# Patient Record
Sex: Male | Born: 1955 | Race: White | Hispanic: No | Smoking: Former smoker
Health system: Southern US, Community
[De-identification: ages and names within clinical notes are randomized; demographics above are authoritative.]

## PROBLEM LIST (undated history)

## (undated) DIAGNOSIS — T7840XA Allergy, unspecified, initial encounter: Secondary | ICD-10-CM

## (undated) DIAGNOSIS — M1990 Unspecified osteoarthritis, unspecified site: Secondary | ICD-10-CM

## (undated) HISTORY — DX: Allergy, unspecified, initial encounter: T78.40XA

## (undated) HISTORY — DX: Unspecified osteoarthritis, unspecified site: M19.90

---

## 2000-02-11 ENCOUNTER — Encounter: Payer: Self-pay | Admitting: Emergency Medicine

## 2000-02-11 ENCOUNTER — Emergency Department (HOSPITAL_COMMUNITY): Admission: EM | Admit: 2000-02-11 | Discharge: 2000-02-11 | Payer: Self-pay | Admitting: Emergency Medicine

## 2009-03-27 ENCOUNTER — Emergency Department: Payer: Self-pay | Admitting: Emergency Medicine

## 2016-09-11 ENCOUNTER — Emergency Department
Admission: EM | Admit: 2016-09-11 | Discharge: 2016-09-11 | Disposition: A | Discharge status: Home / Self Care | Payer: Worker's Compensation | Attending: Emergency Medicine | Admitting: Emergency Medicine

## 2016-09-11 ENCOUNTER — Emergency Department: Payer: Worker's Compensation

## 2016-09-11 ENCOUNTER — Encounter: Payer: Self-pay | Admitting: Emergency Medicine

## 2016-09-11 DIAGNOSIS — Y929 Unspecified place or not applicable: Secondary | ICD-10-CM | POA: Insufficient documentation

## 2016-09-11 DIAGNOSIS — W268XXA Contact with other sharp object(s), not elsewhere classified, initial encounter: Secondary | ICD-10-CM | POA: Diagnosis not present

## 2016-09-11 DIAGNOSIS — F17200 Nicotine dependence, unspecified, uncomplicated: Secondary | ICD-10-CM | POA: Insufficient documentation

## 2016-09-11 DIAGNOSIS — S61222A Laceration with foreign body of right middle finger without damage to nail, initial encounter: Secondary | ICD-10-CM

## 2016-09-11 DIAGNOSIS — Z23 Encounter for immunization: Secondary | ICD-10-CM | POA: Insufficient documentation

## 2016-09-11 DIAGNOSIS — Y990 Civilian activity done for income or pay: Secondary | ICD-10-CM | POA: Diagnosis not present

## 2016-09-11 DIAGNOSIS — Y9389 Activity, other specified: Secondary | ICD-10-CM | POA: Insufficient documentation

## 2016-09-11 MED ORDER — LIDOCAINE HCL (PF) 1 % IJ SOLN
5.0000 mL | Freq: Once | INTRAMUSCULAR | Status: AC
  Administered 2016-09-11: 5 mL
  Filled 2016-09-11: qty 5

## 2016-09-11 MED ORDER — HYDROCODONE-ACETAMINOPHEN 5-325 MG PO TABS: 1.0000 | ORAL_TABLET | Freq: Four times a day (QID) | ORAL | 0 refills | Status: DC | PRN

## 2016-09-11 MED ORDER — TETANUS-DIPHTH-ACELL PERTUSSIS 5-2.5-18.5 LF-MCG/0.5 IM SUSP
0.5000 mL | Freq: Once | INTRAMUSCULAR | Status: AC
  Administered 2016-09-11: 0.5 mL via INTRAMUSCULAR
  Filled 2016-09-11: qty 0.5

## 2016-09-11 MED ORDER — CLINDAMYCIN HCL 300 MG PO CAPS: 300.0000 mg | ORAL_CAPSULE | Freq: Three times a day (TID) | ORAL | 0 refills | Status: AC

## 2016-09-11 NOTE — ED Provider Notes (Signed)
[facility] Emergency Department Provider Note  ____________________________________________  Time seen: Approximately 10:06 AM  I have reviewed the triage vital signs and the nursing notes.   HISTORY  Chief Complaint Laceration    HPI Mark Sexton is a 61 y.o. male, NAD, presents to emergency for evaluation of laceration to the right middle finger. Patient states he was at work when the finger was smashed between 2 pieces of steel. States he had 2 pairs of gloves on and when he removed his gloves he noted the laceration. Denies any numbness, weakness, tingling. Has retained full range of motion of all digits on the right hand. Denies any pain anywhere else. States that his tetanus is not up-to-date.    History reviewed. No pertinent past medical history.  There are no active problems to display for this patient.   History reviewed. No pertinent surgical history.  Prior to Admission medications   Medication Sig Start Date End Date Taking? Authorizing Provider  clindamycin (CLEOCIN) 300 MG capsule Take 1 capsule (300 mg total) by mouth 3 (three) times daily. 09/11/16 09/18/16  Suella Cogar L Zniya Cottone, PA-C  HYDROcodone-acetaminophen (NORCO) 5-325 MG tablet Take 1 tablet by mouth every 6 (six) hours as needed for severe pain. 09/11/16   Eriberto Felch L Audreana Hancox, PA-C    Allergies Penicillins  No family history on file.  Social History Social History  Substance Use Topics  . Smoking status: Current Every Day Smoker  . Smokeless tobacco: Never Used  . Alcohol use Yes     Review of Systems  Constitutional: No fatigue Musculoskeletal: Positive for pain about the right middle finger at the laceration site. No pain about the right hand or wrist. No decrease in ROM of all digits of the right hand.  Skin: Positive laceration right middle finger. Negative for rash, redness, bruising. Neurological: Negative for numbness, weakness or  tingling  ____________________________________________   PHYSICAL EXAM:  VITAL SIGNS: ED Triage Vitals  Enc Vitals Group     BP      Pulse      Resp      Temp      Temp src      SpO2      Weight      Height      Head Circumference      Peak Flow      Pain Score      Pain Loc      Pain Edu?      Excl. in GC?      Constitutional: Alert and oriented. Well appearing and in no acute distress. Eyes: Conjunctivae are normal.  Head: Atraumatic. Cardiovascular: Good peripheral circulation With 2+ pulses noted in right upper extremity. Capillary refill is brisk in all digits of the right hand. Respiratory: Normal respiratory effort without tachypnea or retractions.  Musculoskeletal: No lower extremity tenderness nor edema.  No joint effusions. Neurologic:  Normal speech and language. No gross focal neurologic deficits are appreciated. Sensation to light touch grossly intact throughout the right upper extremity. Skin:  3cm U-shaped flap noted about the pad of the right middle finger. Bleeding is controlled. No visualized or palpated foreign bodies is noted. Mild bruising is noted about the tip of the right middle finger. Skin is warm, dry. No rash noted. Psychiatric: Mood and affect are normal. Speech and behavior are normal. Patient exhibits appropriate insight and judgement.   ____________________________________________   LABS  None ____________________________________________  EKG  None ____________________________________________  RADIOLOGY I, Shontell Prosser L  Derrek Puff, personally viewed and evaluated these images (plain radiographs) as part of my medical decision making, as well as reviewing the written report by the radiologist.  Dg Finger Middle Right  Result Date: 09/11/2016 CLINICAL DATA:  Injury of the right middle finger with known laceration of the tip. No previous injury. EXAM: RIGHT MIDDLE FINGER 2+V COMPARISON:  None in PACs FINDINGS: There are metallic foreign bodies  measuring up to 2 mm in diameter projecting over the swollen distal phalanx. The bones are adequately mineralized. The joint spaces are reasonably well-maintained. IMPRESSION: There is no acute fracture of the middle finger. There is soft tissue swelling and disruption of the pad of the distal phalanx with retained metallic foreign material. Electronically Signed   By: David  [place]azil[place] M.D.   On: 09/11/2016 11:06    ____________________________________________    PROCEDURES  Procedure(s) performed: None   .Marland Kitchen.Laceration Repair Date/Time: 09/11/2016 12:27 PM Performed by: Hope PigeonHAGLER, Quinlan Vollmer L Authorized by: Hope PigeonHAGLER, Danie Diehl L   Consent:    Consent obtained:  Verbal   Consent given by:  Patient   Risks discussed:  Infection, pain, poor cosmetic result and retained foreign body   Alternatives discussed:  No treatment and referral Anesthesia (see MAR for exact dosages):    Anesthesia method:  Nerve block   Block location:  Right middle finger   Block needle gauge:  27 G   Block anesthetic:  Lidocaine 1% w/o epi   Block injection procedure:  Anatomic landmarks identified, anatomic landmarks palpated, introduced needle, negative aspiration for blood and incremental injection   Block outcome:  Anesthesia achieved Laceration details:    Location:  Finger   Finger location:  R long finger   Length (cm):  3   Depth (mm):  4 Repair type:    Repair type:  Intermediate Pre-procedure details:    Preparation:  Patient was prepped and draped in usual sterile fashion and imaging obtained to evaluate for foreign bodies Exploration:    Hemostasis achieved with:  Direct pressure   Wound exploration: wound explored through full range of motion and entire depth of wound probed and visualized     Wound extent: areolar tissue violated, fascia violated and foreign bodies/material     Wound extent: no muscle damage noted, no nerve damage noted, no tendon damage noted, no underlying fracture noted and no vascular  damage noted     Contaminated: yes   Treatment:    Area cleansed with:  Betadine   Amount of cleaning:  Extensive   Irrigation solution:  Sterile saline   Irrigation volume:  200mL   Irrigation method:  Syringe   Visualized foreign bodies/material removed: no (Small metal foreign body fragments on xray but not visualized. Wound thoroughly irrigated and visualized foreign materials rmeoved.)   Skin repair:    Repair method:  Sutures   Suture size:  4-0   Suture material:  Nylon   Suture technique:  Simple interrupted   Number of sutures:  9 Approximation:    Approximation:  Close   Vermilion border: well-aligned   Post-procedure details:    Dressing:  Non-adherent dressing and splint for protection   Patient tolerance of procedure:  Tolerated well, no immediate complications     Medications  Tdap (BOOSTRIX) injection 0.5 mL (0.5 mLs Intramuscular Given 09/11/16 1222)  lidocaine (PF) (XYLOCAINE) 1 % injection 5 mL (5 mLs Infiltration Given 09/11/16 1224)     ____________________________________________   INITIAL IMPRESSION / ASSESSMENT AND PLAN / ED COURSE  Pertinent  labs & imaging results that were available during my care of the patient were reviewed by me and considered in my medical decision making (see chart for details).  Clinical Course     Patient's diagnosis is consistent with laceration of the right middle finger without damage to the nail with foreign bodies. Visualized foreign bodies removed and thorough irrigation completed. Finger splint applied for wound protection. Patient advised to keep the wound clean and dry for 48 hours then may lightly cleanse per usual. Remain in the splint until sutures removed. No strong gripping with the right hand. Patient will be discharged home with prescriptions for clindamycin and norco to take as directed. Patient is to follow up with [facility] in 2 days for wound recheck and again in 1 week for suture removal. Patient is  given ED precautions to return to the ED for any worsening or new symptoms.    ____________________________________________  FINAL CLINICAL IMPRESSION(S) / ED DIAGNOSES  Final diagnoses:  Laceration of right middle finger with foreign body without damage to nail, initial encounter      NEW MEDICATIONS STARTED DURING THIS VISIT:  Discharge Medication List as of 09/11/2016 12:26 PM    START taking these medications   Details  clindamycin (CLEOCIN) 300 MG capsule Take 1 capsule (300 mg total) by mouth 3 (three) times daily., Starting Wed 09/11/2016, Until Wed 09/18/2016, Print    HYDROcodone-acetaminophen (NORCO) 5-325 MG tablet Take 1 tablet by mouth every 6 (six) hours as needed for severe pain., Starting Wed 09/11/2016, Print             Ernestene Kiel [place], PA-C 09/11/16 1921    Sharman Cheek, MD 09/15/16 [phone]

## 2016-09-11 NOTE — ED Triage Notes (Signed)
Laceration noted to right middle finger   states he got it caught in between 2 pieces of metal

## 2016-09-11 NOTE — ED Notes (Signed)
Per Management Darrold Junker(Chris Sutton) no drug screening is required.

## 2020-09-25 ENCOUNTER — Encounter: Payer: Self-pay | Admitting: Emergency Medicine

## 2020-09-25 ENCOUNTER — Emergency Department
Admission: EM | Admit: 2020-09-25 | Discharge: 2020-09-25 | Disposition: A | Discharge status: Home / Self Care | Payer: Medicare Other | Attending: Emergency Medicine | Admitting: Emergency Medicine

## 2020-09-25 ENCOUNTER — Emergency Department: Payer: Medicare Other

## 2020-09-25 ENCOUNTER — Other Ambulatory Visit: Payer: Self-pay

## 2020-09-25 DIAGNOSIS — Z20822 Contact with and (suspected) exposure to covid-19: Secondary | ICD-10-CM | POA: Diagnosis not present

## 2020-09-25 DIAGNOSIS — F17200 Nicotine dependence, unspecified, uncomplicated: Secondary | ICD-10-CM | POA: Diagnosis not present

## 2020-09-25 DIAGNOSIS — R0981 Nasal congestion: Secondary | ICD-10-CM | POA: Diagnosis not present

## 2020-09-25 DIAGNOSIS — R0602 Shortness of breath: Secondary | ICD-10-CM | POA: Diagnosis not present

## 2020-09-25 DIAGNOSIS — J029 Acute pharyngitis, unspecified: Secondary | ICD-10-CM | POA: Insufficient documentation

## 2020-09-25 LAB — BASIC METABOLIC PANEL
Anion gap: 8 (ref 5–15)
BUN: 15 mg/dL (ref 8–23)
CO2: 24 mmol/L (ref 22–32)
Calcium: 9.2 mg/dL (ref 8.9–10.3)
Chloride: 104 mmol/L (ref 98–111)
Creatinine, Ser: 1.1 mg/dL (ref 0.61–1.24)
GFR, Estimated: >60 mL/min (ref >60)
Glucose, Bld: 120 mg/dL — ABNORMAL HIGH (ref 70–99)
Potassium: 4 mmol/L (ref 3.5–5.1)
Sodium: 136 mmol/L (ref 135–145)

## 2020-09-25 LAB — CBC
HCT: 37.8 % — ABNORMAL LOW (ref 39.0–52.0)
Hemoglobin: 13.4 g/dL (ref 13.0–17.0)
MCH: 32.1 pg (ref 26.0–34.0)
MCHC: 35.4 g/dL (ref 30.0–36.0)
MCV: 90.4 fL (ref 80.0–100.0)
Platelets: 186 10*3/uL (ref 150–400)
RBC: 4.18 MIL/uL — ABNORMAL LOW (ref 4.22–5.81)
RDW: 12.3 % (ref 11.5–15.5)
WBC: 10 10*3/uL (ref 4.0–10.5)
nRBC: 0 % (ref 0.0–0.2)

## 2020-09-25 LAB — TROPONIN I (HIGH SENSITIVITY)
Troponin I (High Sensitivity): 18 ng/L — ABNORMAL HIGH (ref <18)
Troponin I (High Sensitivity): 15 ng/L (ref <18)

## 2020-09-25 LAB — SARS CORONAVIRUS 2 (TAT 6-24 HRS): SARS Coronavirus 2: NEGATIVE

## 2020-09-25 LAB — BRAIN NATRIURETIC PEPTIDE: B Natriuretic Peptide: 195.8 pg/mL — ABNORMAL HIGH (ref 0.0–100.0)

## 2020-09-25 MED ORDER — AZITHROMYCIN 250 MG PO TABS: ORAL_TABLET | ORAL | 0 refills | Status: AC

## 2020-09-25 MED ORDER — ACETAMINOPHEN 500 MG PO TABS
1000.0000 mg | ORAL_TABLET | Freq: Once | ORAL | Status: AC
  Administered 2020-09-25: 1000 mg via ORAL
  Filled 2020-09-25: qty 2

## 2020-09-25 MED ORDER — PREDNISONE 10 MG PO TABS: 40.0000 mg | ORAL_TABLET | Freq: Every day | ORAL | 0 refills | Status: AC

## 2020-09-25 NOTE — ED Notes (Signed)
Patient verbalizes understanding of discharge instructions. Opportunity for questioning and answers were provided. Armband removed by staff, pt discharged from ED. Ambulated out to lobby  

## 2020-09-25 NOTE — ED Triage Notes (Signed)
Pt reports for the last few days he has felt like it is hard to breathe. Pt reports sx's have gotten worse especially when he walks of lays down. Pt denies pain

## 2020-09-25 NOTE — ED Notes (Signed)
Ambulating RA sat 94% with exertion, resting 96%

## 2020-09-25 NOTE — Discharge Instructions (Addendum)
I suspect this is most likely is related to COVID. Given your smoking history I am starting you on some steroids and antibiotics in case it is starting to get into your lungs given your shortness of breath. Follow-up with your COVID swab in MyChart. If you are negative and you are continuing to have shortness of breath you should return to ER for repeat evaluation

## 2020-09-25 NOTE — ED Provider Notes (Signed)
[facility] Emergency Department Provider Note  ____________________________________________   Event Date/Time   First MD Initiated Contact with Patient 09/25/20 1152     (approximate)  I have reviewed the triage vital signs and the nursing notes.   HISTORY  Chief Complaint Shortness of Breath    HPI Mark Sexton is a 65 y.o. male with no prior history although has not seen a doctor for years she comes in with not feeling well.  Patient reports multiple symptoms including nasal congestion, sore throat, shortness of breath.  He states that this started a few days ago, constant, nothing makes it better, nothing makes it worse.  Denies really any chest pain.  States that he knew somebody who had COVID a week ago. He was not wearing a mask while he was with them. He has had his vaccines but is not yet been tested for COVID.  Denies any swelling in his legs.  Denies any risk factors for PE.          History reviewed. No pertinent past medical history.  There are no problems to display for this patient.   History reviewed. No pertinent surgical history.  Prior to Admission medications   Medication Sig Start Date End Date Taking? Authorizing Provider  HYDROcodone-acetaminophen (NORCO) 5-325 MG tablet Take 1 tablet by mouth every 6 (six) hours as needed for severe pain. 09/11/16   Hagler, Jami L, PA-C    Allergies Penicillins  No family history on file.  Social History Social History   Tobacco Use  . Smoking status: Current Every Day Smoker  . Smokeless tobacco: Never Used  Substance Use Topics  . Alcohol use: Yes      Review of Systems Constitutional: No fever/chills Eyes: No visual changes. ENT: Sore throat. Congestion Cardiovascular: No chest pain Respiratory: Positive for SOB, cough Gastrointestinal: No abdominal pain.  No nausea, no vomiting.  No diarrhea.  No constipation. Genitourinary: Negative for dysuria. Musculoskeletal:  Negative for back pain. Skin: Negative for rash. Neurological: Negative for headaches, focal weakness or numbness. All other ROS negative ____________________________________________   PHYSICAL EXAM:  VITAL SIGNS: ED Triage Vitals  Enc Vitals Group     BP 09/25/20 0840 109/66     Pulse Rate 09/25/20 0840 69     Resp 09/25/20 0840 20     Temp 09/25/20 0840 98 F (36.7 C)     Temp Source 09/25/20 0840 Oral     SpO2 09/25/20 0840 96 %     Weight 09/25/20 0838 255 lb (115.7 kg)     Height 09/25/20 0838 6\' 1"  (1.854 m)     Head Circumference --      Peak Flow --      Pain Score 09/25/20 0838 0     Pain Loc --      Pain Edu? --      Excl. in GC? --     Constitutional: Alert and oriented. Well appearing and in no acute distress. Eyes: Conjunctivae are normal. EOMI. Head: Atraumatic. Nose: No congestion/rhinnorhea. Mouth/Throat: Mucous membranes are moist.   Neck: No stridor. Trachea Midline. FROM Cardiovascular: Normal rate, regular rhythm. Grossly normal heart sounds.  Good peripheral circulation. Respiratory: Clear lungs bilaterally, no increased work of breathing Gastrointestinal: Soft and nontender. No distention. No abdominal bruits.  Musculoskeletal: No lower extremity tenderness nor edema.  No joint effusions. Neurologic:  Normal speech and language. No gross focal neurologic deficits are appreciated.  Skin:  Skin is warm, dry  and intact. No rash noted. Psychiatric: Mood and affect are normal. Speech and behavior are normal. GU: Deferred   ____________________________________________   LABS (all labs ordered are listed, but only abnormal results are displayed)  Labs Reviewed  BASIC METABOLIC PANEL - Abnormal; Notable for the following components:      Result Value   Glucose, Bld 120 (*)    All other components within normal limits  CBC - Abnormal; Notable for the following components:   RBC 4.18 (*)    HCT 37.8 (*)    All other components within normal limits   TROPONIN I (HIGH SENSITIVITY) - Abnormal; Notable for the following components:   Troponin I (High Sensitivity) 18 (*)    All other components within normal limits  RESP PANEL BY RT-PCR (FLU A&B, COVID) ARPGX2  TROPONIN I (HIGH SENSITIVITY)   ____________________________________________   ED ECG REPORT I, Concha Se, the attending physician, personally viewed and interpreted this ECG.  Normal sinus rate of 77, no st elevation, twi in III, occasional PVCs  ____________________________________________  RADIOLOGY Vela Prose, personally viewed and evaluated these images (plain radiographs) as part of my medical decision making, as well as reviewing the written report by the radiologist.  ED MD interpretation: No pneumonia  Official radiology report(s): DG Chest 2 View  Result Date: 09/25/2020 CLINICAL DATA:  sob EXAM: CHEST - 2 VIEW COMPARISON:  12/17/2002 report. FINDINGS: No focal consolidation. No pneumothorax or pleural effusion. Cardiomediastinal silhouette is within normal limits. No acute osseous abnormality. IMPRESSION: No focal airspace disease. Electronically Signed   By: Stana Bunting M.D.   On: 09/25/2020 12:52    ____________________________________________   PROCEDURES  Procedure(s) performed (including Critical Care):  Procedures   ____________________________________________   INITIAL IMPRESSION / ASSESSMENT AND PLAN / ED COURSE   [name] was evaluated in Emergency Department on 09/25/2020 for the symptoms described in the history of present illness. He was evaluated in the context of the global COVID-19 pandemic, which necessitated consideration that the patient might be at risk for infection with the SARS-CoV-2 virus that causes COVID-19. Institutional protocols and algorithms that pertain to the evaluation of patients at risk for COVID-19 are in a state of rapid change based on information released by regulatory bodies including the CDC and  federal and state organizations. These policies and algorithms were followed during the patient's care in the ED.     Pt presents with SOB.  This is most likely secondary to COVID given recent COVID-positive contact and other infectious symptoms.  PNA-will get xray to evaluation Anemia-CBC to evaluate ACS- will get trops Arrhythmia-Will get EKG and keep on monitor.  COVID- will get testing per algorithm. PE-lower suspicion given no risk factors and other cause more likely. Denies any other risk factors for PE. Unable to D-dimer due to out of reagent my suspicion is so low I do not think it is necessary to proceed with CT scan  Cardiac marker initially was slightly elevated but upon repeat was 15. BNP is slightly elevated but he is got no signs of heart failure. Chest x-ray does not show any signs of pneumonia. I suspect that this is secondary to COVID given his recent positive contact and its prevalence in the community at this time. I do not feel like CT imaging is warranted given my high suspicion for COVID given his multiple symptoms. He ambulated around the room with desaturations down to 93%. Patient is a recent smoker so we will  start him on some steroids and antibiotics. We discussed that if his symptoms are getting worse that he should return to the ER for repeat evaluation.   _____________________________________   FINAL CLINICAL IMPRESSION(S) / ED DIAGNOSES   Final diagnoses:  Suspected COVID-19 virus infection     MEDICATIONS GIVEN DURING THIS VISIT:  Medications  acetaminophen (TYLENOL) tablet 1,000 mg (1,000 mg Oral Given 09/25/20 1241)     ED Discharge Orders         Ordered    azithromycin (ZITHROMAX Z-PAK) 250 MG tablet        09/25/20 1324    predniSONE (DELTASONE) 10 MG tablet  Daily        09/25/20 1324           Note:  This document was prepared using Dragon voice recognition software and may include unintentional dictation errors.   Concha Se,  MD 09/25/20 1325

## 2020-09-25 NOTE — ED Triage Notes (Signed)
Pt called for EKG, no response

## 2020-09-25 NOTE — ED Triage Notes (Signed)
Pt in via EMS from home with c/o SOB. EMS reports pt with SOB for past couple of days. No Hx but has not seen a MD in a few years. Pt reports SOB gets worse on exertion. Lungs clear per EMS. #18 g to left AC. 141/96. HR 94, FSBS 164

## 2020-11-21 ENCOUNTER — Ambulatory Visit: Payer: Medicare Other | Admitting: Nurse Practitioner

## 2020-11-21 NOTE — Progress Notes (Deleted)
   New Patient Office Visit  Subjective:  Patient ID: Mark Sexton, male    DOB: 02-12-1956  Age: 65 y.o. MRN: [id number]  CC: No chief complaint on file.   HPI Mark Sexton presents for ***  No past medical history on file.  No past surgical history on file.  No family history on file.  Social History   Socioeconomic History  . Marital status: Legally Separated    Spouse name: Not on file  . Number of children: Not on file  . Years of education: Not on file  . Highest education level: Not on file  Occupational History  . Not on file  Tobacco Use  . Smoking status: Current Every Day Smoker  . Smokeless tobacco: Never Used  Substance and Sexual Activity  . Alcohol use: Yes  . Drug use: Not on file  . Sexual activity: Not on file  Other Topics Concern  . Not on file  Social History Narrative  . Not on file   Social Determinants of Health   Financial Resource Strain: Not on file  Food Insecurity: Not on file  Transportation Needs: Not on file  Physical Activity: Not on file  Stress: Not on file  Social Connections: Not on file  Intimate Partner Violence: Not on file    ROS Review of Systems  Objective:   Today's Vitals: There were no vitals taken for this visit.  Physical Exam  Assessment & Plan:   Problem List Items Addressed This Visit   None     Outpatient Encounter Medications as of 11/21/2020  Medication Sig  . HYDROcodone-acetaminophen (NORCO) 5-325 MG tablet Take 1 tablet by mouth every 6 (six) hours as needed for severe pain.   No facility-administered encounter medications on file as of 11/21/2020.    Follow-up: No follow-ups on file.   Gerre Scull, NP

## 2020-12-27 ENCOUNTER — Other Ambulatory Visit: Payer: Self-pay

## 2020-12-27 ENCOUNTER — Encounter: Payer: Self-pay | Admitting: Internal Medicine

## 2020-12-27 ENCOUNTER — Ambulatory Visit
Admission: RE | Admit: 2020-12-27 | Discharge: 2020-12-27 | Disposition: A | Discharge status: Home / Self Care | Payer: Medicare Other | Source: Ambulatory Visit | Attending: Internal Medicine | Admitting: Internal Medicine

## 2020-12-27 ENCOUNTER — Ambulatory Visit (INDEPENDENT_AMBULATORY_CARE_PROVIDER_SITE_OTHER): Payer: Medicare Other | Admitting: Internal Medicine

## 2020-12-27 ENCOUNTER — Ambulatory Visit
Admission: RE | Admit: 2020-12-27 | Discharge: 2020-12-27 | Disposition: A | Discharge status: Home / Self Care | Payer: Medicare Other | Attending: Internal Medicine | Admitting: Internal Medicine

## 2020-12-27 VITALS — BP 127/86 | HR 78 | Temp 99.2°F | Ht 71.1 in | Wt 258.8 lb

## 2020-12-27 DIAGNOSIS — G8929 Other chronic pain: Secondary | ICD-10-CM | POA: Insufficient documentation

## 2020-12-27 DIAGNOSIS — M25561 Pain in right knee: Secondary | ICD-10-CM

## 2020-12-27 DIAGNOSIS — M25562 Pain in left knee: Secondary | ICD-10-CM | POA: Insufficient documentation

## 2020-12-27 NOTE — Progress Notes (Signed)
BP 127/86   Pulse 78   Temp 99.2 F (37.3 C) (Oral)   Ht 5' 11.1" (1.806 m)   Wt 258 lb 12.8 oz (117.4 kg)   SpO2 96%   BMI 35.99 kg/m    Subjective:    Patient ID: Mark Sexton, male    DOB: 1956/04/25, 65 y.o.   MRN: [id number]  HPI: Mark Sexton is a 65 y.o. male  Pt is here to establish care, pt has a ho bil knee pain, not seen a pcp ever per his verbal record. Stopped working when pandemic hit.  Works as a Nurse, learning disability Pain  Incident onset: > 1 yr ago has seen ortho in the past had imaging done 2001, had shots in knees per them about 18 yrs ago. Injury mechanism: played sports and had injury. The pain is present in the left ankle. The pain is at a severity of 8/10 (takes : " suboxone off the street " ). The pain is severe. The pain has been worsening since onset. Associated symptoms include an inability to bear weight. Pertinent negatives include no loss of motion, loss of sensation, muscle weakness, numbness or tingling. He has tried acetaminophen, NSAIDs, ice and heat for the symptoms.    Chief Complaint  Patient presents with  . New Patient (Initial Visit)  . opiod dependent    Taking suboxone  . Knee Pain    Left knee pain x 10 years.    Relevant past medical, surgical, family and social history reviewed and updated as indicated. Interim medical history since our last visit reviewed. Allergies and medications reviewed and updated.  Review of Systems  Neurological: Negative for tingling and numbness.    Per HPI unless specifically indicated above     Objective:    BP 127/86   Pulse 78   Temp 99.2 F (37.3 C) (Oral)   Ht 5' 11.1" (1.806 m)   Wt 258 lb 12.8 oz (117.4 kg)   SpO2 96%   BMI 35.99 kg/m   Wt Readings from Last 3 Encounters:  12/27/20 258 lb 12.8 oz (117.4 kg)  09/25/20 255 lb (115.7 kg)  09/11/16 230 lb (104.3 kg)    Physical Exam Vitals and nursing note reviewed.  Constitutional:      General: He is not in acute  distress.    Appearance: Normal appearance. He is not ill-appearing or diaphoretic.  HENT:     Head: Normocephalic and atraumatic.     Right Ear: Tympanic membrane and external ear normal. There is no impacted cerumen.     Left Ear: External ear normal.     Nose: No congestion or rhinorrhea.     Mouth/Throat:     Pharynx: No oropharyngeal exudate or posterior oropharyngeal erythema.  Eyes:     Conjunctiva/sclera: Conjunctivae normal.     Pupils: Pupils are equal, round, and reactive to light.  Cardiovascular:     Rate and Rhythm: Normal rate and regular rhythm.     Heart sounds: No murmur heard. No friction rub. No gallop.   Pulmonary:     Effort: No respiratory distress.     Breath sounds: No stridor. No wheezing or rhonchi.  Chest:     Chest wall: No tenderness.  Abdominal:     General: Abdomen is flat. Bowel sounds are normal.     Palpations: Abdomen is soft. There is no mass.     Tenderness: There is no abdominal tenderness.  Musculoskeletal:  Cervical back: Normal range of motion and neck supple. No rigidity or tenderness.     Left lower leg: No edema.  Skin:    General: Skin is warm and dry.  Neurological:     Mental Status: He is alert.     Results for orders placed or performed during the hospital encounter of 09/25/20  SARS CORONAVIRUS 2 (TAT 6-24 HRS) Nasopharyngeal Nasopharyngeal Swab   Specimen: Nasopharyngeal Swab  Result Value Ref Range   SARS Coronavirus 2 NEGATIVE NEGATIVE  Basic metabolic panel  Result Value Ref Range   Sodium 136 135 - 145 mmol/L   Potassium 4.0 3.5 - 5.1 mmol/L   Chloride 104 98 - 111 mmol/L   CO2 24 22 - 32 mmol/L   Glucose, Bld 120 (H) 70 - 99 mg/dL   BUN 15 8 - 23 mg/dL   Creatinine, Ser 5.63 0.61 - 1.24 mg/dL   Calcium 9.2 8.9 - 89.3 mg/dL   GFR, Estimated >73 >42 mL/min   Anion gap 8 5 - 15  CBC  Result Value Ref Range   WBC 10.0 4.0 - 10.5 K/uL   RBC 4.18 (L) 4.22 - 5.81 MIL/uL   Hemoglobin 13.4 13.0 - 17.0 g/dL    HCT 87.6 (L) 81.1 - 52.0 %   MCV 90.4 80.0 - 100.0 fL   MCH 32.1 26.0 - 34.0 pg   MCHC 35.4 30.0 - 36.0 g/dL   RDW 57.2 62.0 - 35.5 %   Platelets 186 150 - 400 K/uL   nRBC 0.0 0.0 - 0.2 %  Brain natriuretic peptide  Result Value Ref Range   B Natriuretic Peptide 195.8 (H) 0.0 - 100.0 pg/mL  Troponin I (High Sensitivity)  Result Value Ref Range   Troponin I (High Sensitivity) 18 (H) <18 ng/L  Troponin I (High Sensitivity)  Result Value Ref Range   Troponin I (High Sensitivity) 15 <18 ng/L        Current Outpatient Medications:  .  buprenorphine-naloxone (SUBOXONE) 2-0.5 mg SUBL SL tablet, Place under the tongue., Disp: , Rfl:     Assessment & Plan:  1. bil knee pain ? To Arthritis :? sec RA vs Gout CHeck RF/ ANA / uric acid. check xrays of swollen joints. consider referral to rheumatology pt takes pain meds for such. refer to Physical therapy as well. Orders Placed This Encounter  Procedures  . DG Knee Complete 4 Views Left    Order Specific Question:   Reason for Exam (SYMPTOM  OR DIAGNOSIS REQUIRED)    Answer:   knee pain    Order Specific Question:   Preferred imaging location?    Answer:   [facility] Knee Complete 4 Views Right    Order Specific Question:   Reason for Exam (SYMPTOM  OR DIAGNOSIS REQUIRED)    Answer:   knee pain    Order Specific Question:   Preferred imaging location?    Answer:   [facility] Cheree Ditto  . Rheumatoid factor  . ANA  . ANA+ENA+DNA/DS+Antich+Centr  . Uric acid  . Comprehensive metabolic panel  . CBC with Differential/Platelet  . Lyme Ab/Western Blot Reflex  . Ambulatory referral to Pain Clinic    Referral Priority:   Urgent    Referral Type:   Consultation    Referral Reason:   Specialty Services Required    Requested Specialty:   Pain Medicine    Number of Visits Requested:   1  . Ambulatory referral to Physical Therapy  Referral Priority:   Urgent    Referral Type:   Physical Medicine    Referral Reason:   Specialty  Services Required    Requested Specialty:   Physical Therapy    Number of Visits Requested:   1    Problem List Items Addressed This Visit   None      Follow up plan: No follow-ups on file.

## 2020-12-28 LAB — CBC WITH DIFFERENTIAL/PLATELET
Basophils Absolute: 0.1 10*3/uL (ref 0.0–0.2)
EOS (ABSOLUTE): 0.2 10*3/uL (ref 0.0–0.4)
Hemoglobin: 15.6 g/dL (ref 13.0–17.7)
Monocytes: 12 %

## 2020-12-28 LAB — ANA+ENA+DNA/DS+ANTICH+CENTR
Chromatin Ab SerPl-aCnc: <0.2 AI (ref 0.0–0.9)
dsDNA Ab: <1 IU/mL (ref 0–9)

## 2020-12-28 LAB — ANA: Anti Nuclear Antibody (ANA): NEGATIVE

## 2020-12-28 LAB — COMPREHENSIVE METABOLIC PANEL
Albumin: 5 g/dL — ABNORMAL HIGH (ref 3.8–4.8)
Chloride: 103 mmol/L (ref 96–106)

## 2020-12-29 ENCOUNTER — Other Ambulatory Visit: Payer: Self-pay

## 2020-12-29 ENCOUNTER — Ambulatory Visit (INDEPENDENT_AMBULATORY_CARE_PROVIDER_SITE_OTHER): Payer: Medicare Other | Admitting: Internal Medicine

## 2020-12-29 ENCOUNTER — Encounter: Payer: Self-pay | Admitting: Internal Medicine

## 2020-12-29 VITALS — BP 137/81 | HR 87 | Temp 98.8°F | Ht 71.1 in | Wt 258.0 lb

## 2020-12-29 DIAGNOSIS — G8929 Other chronic pain: Secondary | ICD-10-CM | POA: Diagnosis not present

## 2020-12-29 DIAGNOSIS — M25562 Pain in left knee: Secondary | ICD-10-CM | POA: Diagnosis not present

## 2020-12-29 LAB — URIC ACID: Uric Acid: 7.5 mg/dL (ref 3.8–8.4)

## 2020-12-29 LAB — CBC WITH DIFFERENTIAL/PLATELET
Basos: 1 %
Eos: 3 %
Hematocrit: 46.2 % (ref 37.5–51.0)
Immature Grans (Abs): 0 10*3/uL (ref 0.0–0.1)
Immature Granulocytes: 1 %
Lymphocytes Absolute: 1.8 10*3/uL (ref 0.7–3.1)
Lymphs: 25 %
MCH: 31 pg (ref 26.6–33.0)
MCHC: 33.8 g/dL (ref 31.5–35.7)
MCV: 92 fL (ref 79–97)
Monocytes Absolute: 0.9 10*3/uL (ref 0.1–0.9)
Neutrophils Absolute: 4.1 10*3/uL (ref 1.4–7.0)
Neutrophils: 58 %
Platelets: 212 10*3/uL (ref 150–450)
RBC: 5.03 x10E6/uL (ref 4.14–5.80)
RDW: 12.5 % (ref 11.6–15.4)
WBC: 7.1 10*3/uL (ref 3.4–10.8)

## 2020-12-29 LAB — COMPREHENSIVE METABOLIC PANEL
ALT: 74 IU/L — ABNORMAL HIGH (ref 0–44)
AST: 49 IU/L — ABNORMAL HIGH (ref 0–40)
Albumin/Globulin Ratio: 1.9 (ref 1.2–2.2)
Alkaline Phosphatase: 96 IU/L (ref 44–121)
BUN/Creatinine Ratio: 12 (ref 10–24)
BUN: 15 mg/dL (ref 8–27)
Bilirubin Total: 0.5 mg/dL (ref 0.0–1.2)
CO2: 20 mmol/L (ref 20–29)
Calcium: 9.8 mg/dL (ref 8.6–10.2)
Creatinine, Ser: 1.28 mg/dL — ABNORMAL HIGH (ref 0.76–1.27)
Globulin, Total: 2.6 g/dL (ref 1.5–4.5)
Glucose: 108 mg/dL — ABNORMAL HIGH (ref 65–99)
Potassium: 4.3 mmol/L (ref 3.5–5.2)
Sodium: 141 mmol/L (ref 134–144)
Total Protein: 7.6 g/dL (ref 6.0–8.5)
eGFR: 62 mL/min/{1.73_m2} (ref >59)

## 2020-12-29 LAB — ANA+ENA+DNA/DS+ANTICH+CENTR
ANA Titer 1: NEGATIVE
Anti JO-1: <0.2 AI (ref 0.0–0.9)
Centromere Ab Screen: <0.2 AI (ref 0.0–0.9)
ENA RNP Ab: <0.2 AI (ref 0.0–0.9)
ENA SM Ab Ser-aCnc: <0.2 AI (ref 0.0–0.9)
ENA SSA (RO) Ab: <0.2 AI (ref 0.0–0.9)
ENA SSB (LA) Ab: <0.2 AI (ref 0.0–0.9)
Scleroderma (Scl-70) (ENA) Antibody, IgG: <0.2 AI (ref 0.0–0.9)

## 2020-12-29 LAB — LYME AB/WESTERN BLOT REFLEX
LYME DISEASE AB, QUANT, IGM: <0.8 index (ref 0.00–0.79)
Lyme IgG/IgM Ab: <0.91 {ISR} (ref 0.00–0.90)

## 2020-12-29 LAB — RHEUMATOID FACTOR: Rhuematoid fact SerPl-aCnc: <10 IU/mL (ref <14.0)

## 2020-12-29 MED ORDER — TRIAMCINOLONE ACETONIDE 40 MG/ML IJ SUSP
40.0000 mg | Freq: Once | INTRAMUSCULAR | Status: AC
  Administered 2020-12-29: 40 mg via INTRAMUSCULAR

## 2020-12-29 NOTE — Progress Notes (Signed)
BP 137/81   Pulse 87   Temp 98.8 F (37.1 C) (Oral)   Ht 5' 11.1" (1.806 m)   Wt 258 lb (117 kg)   SpO2 97%   BMI 35.88 kg/m    Subjective:    Patient ID: Mark Sexton, male    DOB: [birth year], 65 y.o.   MRN: [id number]  HPI: Mark Sexton is a 65 y.o. male  Knee Pain  The incident occurred more than 1 week ago (chronic pain ). The pain is present in the left knee. The pain is at a severity of 8/10. The pain is moderate. Associated symptoms include an inability to bear weight. Pertinent negatives include no loss of motion, loss of sensation, muscle weakness, numbness or tingling.    Chief Complaint  Patient presents with  . knee injection    Patient states that he would like the left knee today     Relevant past medical, surgical, family and social history reviewed and updated as indicated. Interim medical history since our last visit reviewed. Allergies and medications reviewed and updated.  Review of Systems  Constitutional: Negative for activity change, appetite change, chills, fatigue and fever.  HENT: Negative for congestion.   Eyes: Negative for visual disturbance.  Respiratory: Negative for apnea, cough, chest tightness, shortness of breath and wheezing.   Cardiovascular: Negative for chest pain, palpitations and leg swelling.  Gastrointestinal: Negative for abdominal distention, abdominal pain, diarrhea and nausea.  Endocrine: Negative for cold intolerance, heat intolerance, polydipsia, polyphagia and polyuria.  Genitourinary: Negative for difficulty urinating, dysuria, frequency, hematuria and urgency.  Skin: Negative for color change and rash.  Neurological: Negative for dizziness, tingling, speech difficulty, weakness, light-headedness, numbness and headaches.  Psychiatric/Behavioral: Negative for behavioral problems and confusion. The patient is not nervous/anxious.     Per HPI unless specifically indicated above     Objective:    BP 137/81   Pulse 87    Temp 98.8 F (37.1 C) (Oral)   Ht 5' 11.1" (1.806 m)   Wt 258 lb (117 kg)   SpO2 97%   BMI 35.88 kg/m   Wt Readings from Last 3 Encounters:  12/29/20 258 lb (117 kg)  12/27/20 258 lb 12.8 oz (117.4 kg)  09/25/20 255 lb (115.7 kg)    Physical Exam Vitals and nursing note reviewed.  Constitutional:      General: He is not in acute distress.    Appearance: Normal appearance. He is not ill-appearing or diaphoretic.  HENT:     Head: Atraumatic.  Cardiovascular:     Rate and Rhythm: Regular rhythm.  Musculoskeletal:        General: Swelling and tenderness present. No deformity or signs of injury.     Right lower leg: No edema.     Left lower leg: No edema.     Comments: Left knee pain  Skin:    General: Skin is warm and dry.  Neurological:     Mental Status: He is alert.  Psychiatric:        Mood and Affect: Mood normal.        Behavior: Behavior normal.        Thought Content: Thought content normal.     Results for orders placed or performed in visit on 12/27/20  Rheumatoid factor  Result Value Ref Range   Rhuematoid fact SerPl-aCnc <10.0 <14.0 IU/mL  ANA  Result Value Ref Range   Anti Nuclear Antibody (ANA) Negative Negative  ANA+ENA+DNA/DS+Antich+Centr  Result Value  Ref Range   ANA Titer 1 WILL FOLLOW    dsDNA Ab <1 0 - 9 IU/mL   ENA RNP Ab <0.2 0.0 - 0.9 AI   ENA SM Ab Ser-aCnc <0.2 0.0 - 0.9 AI   Scleroderma (Scl-70) (ENA) Antibody, IgG <0.2 0.0 - 0.9 AI   ENA SSA (RO) Ab <0.2 0.0 - 0.9 AI   ENA SSB (LA) Ab <0.2 0.0 - 0.9 AI   Chromatin Ab SerPl-aCnc <0.2 0.0 - 0.9 AI   Anti JO-1 <0.2 0.0 - 0.9 AI   Centromere Ab Screen <0.2 0.0 - 0.9 AI   See below: Comment   Uric acid  Result Value Ref Range   Uric Acid 7.5 3.8 - 8.4 mg/dL  Comprehensive metabolic panel  Result Value Ref Range   Glucose 108 (H) 65 - 99 mg/dL   BUN 15 8 - 27 mg/dL   Creatinine, Ser 1.28 (H) 0.76 - 1.27 mg/dL   eGFR 62 >59 mL/min/1.73   BUN/Creatinine Ratio 12 10 - 24   Sodium 141  134 - 144 mmol/L   Potassium 4.3 3.5 - 5.2 mmol/L   Chloride 103 96 - 106 mmol/L   CO2 20 20 - 29 mmol/L   Calcium 9.8 8.6 - 10.2 mg/dL   Total Protein 7.6 6.0 - 8.5 g/dL   Albumin 5.0 (H) 3.8 - 4.8 g/dL   Globulin, Total 2.6 1.5 - 4.5 g/dL   Albumin/Globulin Ratio 1.9 1.2 - 2.2   Bilirubin Total 0.5 0.0 - 1.2 mg/dL   Alkaline Phosphatase 96 44 - 121 IU/L   AST 49 (H) 0 - 40 IU/L   ALT 74 (H) 0 - 44 IU/L  CBC with Differential/Platelet  Result Value Ref Range   WBC 7.1 3.4 - 10.8 x10E3/uL   RBC 5.03 4.14 - 5.80 x10E6/uL   Hemoglobin 15.6 13.0 - 17.7 g/dL   Hematocrit 46.2 37.5 - 51.0 %   MCV 92 79 - 97 fL   MCH 31.0 26.6 - 33.0 pg   MCHC 33.8 31.5 - 35.7 g/dL   RDW 12.5 11.6 - 15.4 %   Platelets 212 150 - 450 x10E3/uL   Neutrophils 58 Not Estab. %   Lymphs 25 Not Estab. %   Monocytes 12 Not Estab. %   Eos 3 Not Estab. %   Basos 1 Not Estab. %   Neutrophils Absolute 4.1 1.4 - 7.0 x10E3/uL   Lymphocytes Absolute 1.8 0.7 - 3.1 x10E3/uL   Monocytes Absolute 0.9 0.1 - 0.9 x10E3/uL   EOS (ABSOLUTE) 0.2 0.0 - 0.4 x10E3/uL   Basophils Absolute 0.1 0.0 - 0.2 x10E3/uL   Immature Granulocytes 1 Not Estab. %   Immature Grans (Abs) 0.0 0.0 - 0.1 x10E3/uL  Lyme Ab/Western Blot Reflex  Result Value Ref Range   Lyme IgG/IgM Ab <0.91 0.00 - 0.90 ISR   LYME DISEASE AB, QUANT, IGM <0.80 0.00 - 0.79 index        Current Outpatient Medications:  .  buprenorphine-naloxone (SUBOXONE) 2-0.5 mg SUBL SL tablet, Place under the tongue., Disp: , Rfl:   Current Facility-Administered Medications:  .  triamcinolone acetonide (KENALOG-40) injection 40 mg, 40 mg, Intramuscular, Once, Connell Bognar, MD    Left knee xrays : Osseous demineralization. Lateral subluxation of tibia. Diffuse joint space narrowing greatest at medial compartment. No acute fracture, dislocation, or bone destruction. No joint effusion.  IMPRESSION: Osteoarthritic changes RIGHT knee, greatest at medial  compartment.   Assessment & Plan:  Knee injection  The risks,  benefits and side effects of treatment were discussed with the patient. After consent was obtained, using sterile technique the left knee was prepped Pt's left knee joint prepped and cleaned with alcohol wipes.   patella was palpated and  pt injected with 4 ml of kenalog and 1 ml of lidocaine under aseptic conditions. no bleeding or post procedural pain noted. Pt felt better after procedure, tolerated procedure well.  The patient was advised to call the office if symptoms worsen or do not improve.The patient is asked to continue to rest the knee for a few more days before resuming regular activities .Call or return to clinic prn if such symptoms occur or the knee fails to improve as anticipated.   Problem List Items Addressed This Visit   None   Visit Diagnoses    Chronic pain of left knee    -  Primary   Relevant Medications   triamcinolone acetonide (KENALOG-40) injection 40 mg (Start on 12/29/2020 10:00 AM)       Follow up plan: No follow-ups on file.

## 2021-01-08 ENCOUNTER — Ambulatory Visit (INDEPENDENT_AMBULATORY_CARE_PROVIDER_SITE_OTHER): Payer: Medicare Other | Admitting: Internal Medicine

## 2021-01-08 ENCOUNTER — Encounter: Payer: Self-pay | Admitting: Internal Medicine

## 2021-01-08 ENCOUNTER — Other Ambulatory Visit: Payer: Self-pay

## 2021-01-08 VITALS — BP 138/78 | HR 62 | Temp 98.3°F | Ht 71.46 in | Wt 255.4 lb

## 2021-01-08 DIAGNOSIS — Z136 Encounter for screening for cardiovascular disorders: Secondary | ICD-10-CM

## 2021-01-08 DIAGNOSIS — Z131 Encounter for screening for diabetes mellitus: Secondary | ICD-10-CM

## 2021-01-08 DIAGNOSIS — Z1329 Encounter for screening for other suspected endocrine disorder: Secondary | ICD-10-CM

## 2021-01-08 DIAGNOSIS — M25561 Pain in right knee: Secondary | ICD-10-CM

## 2021-01-08 DIAGNOSIS — Z79899 Other long term (current) drug therapy: Secondary | ICD-10-CM

## 2021-01-08 DIAGNOSIS — Z125 Encounter for screening for malignant neoplasm of prostate: Secondary | ICD-10-CM

## 2021-01-08 MED ORDER — TRIAMCINOLONE ACETONIDE 40 MG/ML IJ SUSP
40.0000 mg | Freq: Once | INTRAMUSCULAR | Status: AC
  Administered 2021-01-08: 40 mg via INTRAMUSCULAR

## 2021-01-08 NOTE — Progress Notes (Signed)
BP 138/78   Pulse 62   Temp 98.3 F (36.8 C) (Oral)   Ht 5' 11.46" (1.815 m)   Wt 255 lb 6.4 oz (115.8 kg)   SpO2 98%   BMI 35.17 kg/m    Subjective:    Patient ID: Mark Sexton, male    DOB: 07/04/1956, 65 y.o.   MRN: [id number]  HPI: Mark Sexton is a 65 y.o. male  Pt is here for a right knee injection. He has OA in his bil knees, RF> ANA/ Uric acid levels wnl.    Chief Complaint  Patient presents with  . Right Knee Injection    Relevant past medical, surgical, family and social history reviewed and updated as indicated. Interim medical history since our last visit reviewed. Allergies and medications reviewed and updated.  Review of Systems  Constitutional: Negative for activity change, appetite change, chills, fatigue and fever.  HENT: Negative for congestion, dental problem and ear pain.   Eyes: Negative for pain, discharge and itching.  Respiratory: Negative for wheezing.   Cardiovascular: Negative for palpitations.  Gastrointestinal: Negative for rectal pain and vomiting.  Endocrine: Negative for heat intolerance, polydipsia, polyphagia and polyuria.  Genitourinary: Negative for dysuria, enuresis, flank pain, frequency, hematuria and urgency.  Musculoskeletal: Positive for arthralgias, joint swelling and myalgias. Negative for gait problem and neck stiffness.  Skin: Negative for color change, rash and wound.  Psychiatric/Behavioral: Negative for agitation, confusion, decreased concentration, sleep disturbance and suicidal ideas.    Per HPI unless specifically indicated above     Objective:    BP 138/78   Pulse 62   Temp 98.3 F (36.8 C) (Oral)   Ht 5' 11.46" (1.815 m)   Wt 255 lb 6.4 oz (115.8 kg)   SpO2 98%   BMI 35.17 kg/m   Wt Readings from Last 3 Encounters:  01/08/21 255 lb 6.4 oz (115.8 kg)  12/29/20 258 lb (117 kg)  12/27/20 258 lb 12.8 oz (117.4 kg)    Physical Exam Vitals and nursing note reviewed.  Constitutional:      General: He is  not in acute distress.    Appearance: Normal appearance. He is not ill-appearing or diaphoretic.  HENT:     Head: Atraumatic.  Cardiovascular:     Rate and Rhythm: Regular rhythm.  Musculoskeletal:        General: Tenderness present. No swelling, deformity or signs of injury.     Right lower leg: No edema.     Left lower leg: No edema.     Comments: Rt knee pain noted.  Skin:    General: Skin is warm and dry.     Coloration: Skin is not pale.  Neurological:     Mental Status: He is alert.  Psychiatric:        Mood and Affect: Mood normal.        Behavior: Behavior normal.        Thought Content: Thought content normal.     Results for orders placed or performed in visit on 12/27/20  Rheumatoid factor  Result Value Ref Range   Rhuematoid fact SerPl-aCnc <10.0 <14.0 IU/mL  ANA  Result Value Ref Range   Anti Nuclear Antibody (ANA) Negative Negative  ANA+ENA+DNA/DS+Antich+Centr  Result Value Ref Range   ANA Titer 1 Negative    dsDNA Ab <1 0 - 9 IU/mL   ENA RNP Ab <0.2 0.0 - 0.9 AI   ENA SM Ab Ser-aCnc <0.2 0.0 - 0.9 AI  Scleroderma (Scl-70) (ENA) Antibody, IgG <0.2 0.0 - 0.9 AI   ENA SSA (RO) Ab <0.2 0.0 - 0.9 AI   ENA SSB (LA) Ab <0.2 0.0 - 0.9 AI   Chromatin Ab SerPl-aCnc <0.2 0.0 - 0.9 AI   Anti JO-1 <0.2 0.0 - 0.9 AI   Centromere Ab Screen <0.2 0.0 - 0.9 AI   See below: Comment   Uric acid  Result Value Ref Range   Uric Acid 7.5 3.8 - 8.4 mg/dL  Comprehensive metabolic panel  Result Value Ref Range   Glucose 108 (H) 65 - 99 mg/dL   BUN 15 8 - 27 mg/dL   Creatinine, Ser 1.28 (H) 0.76 - 1.27 mg/dL   eGFR 62 >59 mL/min/1.73   BUN/Creatinine Ratio 12 10 - 24   Sodium 141 134 - 144 mmol/L   Potassium 4.3 3.5 - 5.2 mmol/L   Chloride 103 96 - 106 mmol/L   CO2 20 20 - 29 mmol/L   Calcium 9.8 8.6 - 10.2 mg/dL   Total Protein 7.6 6.0 - 8.5 g/dL   Albumin 5.0 (H) 3.8 - 4.8 g/dL   Globulin, Total 2.6 1.5 - 4.5 g/dL   Albumin/Globulin Ratio 1.9 1.2 - 2.2   Bilirubin  Total 0.5 0.0 - 1.2 mg/dL   Alkaline Phosphatase 96 44 - 121 IU/L   AST 49 (H) 0 - 40 IU/L   ALT 74 (H) 0 - 44 IU/L  CBC with Differential/Platelet  Result Value Ref Range   WBC 7.1 3.4 - 10.8 x10E3/uL   RBC 5.03 4.14 - 5.80 x10E6/uL   Hemoglobin 15.6 13.0 - 17.7 g/dL   Hematocrit 46.2 37.5 - 51.0 %   MCV 92 79 - 97 fL   MCH 31.0 26.6 - 33.0 pg   MCHC 33.8 31.5 - 35.7 g/dL   RDW 12.5 11.6 - 15.4 %   Platelets 212 150 - 450 x10E3/uL   Neutrophils 58 Not Estab. %   Lymphs 25 Not Estab. %   Monocytes 12 Not Estab. %   Eos 3 Not Estab. %   Basos 1 Not Estab. %   Neutrophils Absolute 4.1 1.4 - 7.0 x10E3/uL   Lymphocytes Absolute 1.8 0.7 - 3.1 x10E3/uL   Monocytes Absolute 0.9 0.1 - 0.9 x10E3/uL   EOS (ABSOLUTE) 0.2 0.0 - 0.4 x10E3/uL   Basophils Absolute 0.1 0.0 - 0.2 x10E3/uL   Immature Granulocytes 1 Not Estab. %   Immature Grans (Abs) 0.0 0.0 - 0.1 x10E3/uL  Lyme Ab/Western Blot Reflex  Result Value Ref Range   Lyme IgG/IgM Ab <0.91 0.00 - 0.90 ISR   LYME DISEASE AB, QUANT, IGM <0.80 0.00 - 0.79 index        Current Outpatient Medications:  .  buprenorphine-naloxone (SUBOXONE) 2-0.5 mg SUBL SL tablet, Place under the tongue., Disp: , Rfl:     Assessment & Plan:   Knee injection:  The risks, benefits and side effects of treatment were discussed with the patient. After consent was obtained, using sterile technique the left knee was prepped Pt's right knee joint prepped and cleaned with alcohol wipes.   patella was palpated and  pt injected with 4 ml of kenalog and 1 ml of lidocaine under aseptic conditions. no bleeding or post procedural pain noted. Pt felt better after procedure, tolerated procedure well.  The patient was advised to call the office if symptoms worsen or do not improve.  The patient is asked to continue to rest the knee for a few more days  before resuming regular activities .Call or return to clinic prn if such symptoms occur or the knee fails to improve as  anticipated.  Problem List Items Addressed This Visit   None   Visit Diagnoses    Right knee pain, unspecified chronicity    -  Primary   Relevant Medications   triamcinolone acetonide (KENALOG-40) injection 40 mg (Completed)   Screening PSA (prostate specific antigen)       Relevant Orders   PSA Total+%Free (Serial)   Encounter for long-term (current) use of high-risk medication       Relevant Orders   Comprehensive metabolic panel   CBC with Differential/Platelet   Screening for cardiovascular condition       Relevant Orders   Lipid panel   Screening for diabetes mellitus (DM)       Relevant Orders   Bayer DCA Hb A1c Waived   Screening for thyroid disorder       Relevant Orders   Thyroid Panel With TSH       Follow up plan: No follow-ups on file.

## 2021-02-14 ENCOUNTER — Other Ambulatory Visit: Payer: Medicare Other

## 2021-02-21 ENCOUNTER — Ambulatory Visit: Payer: Medicare Other | Admitting: Internal Medicine

## 2021-02-22 ENCOUNTER — Other Ambulatory Visit: Payer: Self-pay

## 2021-02-22 ENCOUNTER — Encounter: Payer: Self-pay | Admitting: Internal Medicine

## 2021-02-22 ENCOUNTER — Ambulatory Visit (INDEPENDENT_AMBULATORY_CARE_PROVIDER_SITE_OTHER): Payer: Medicare Other | Admitting: Internal Medicine

## 2021-02-22 VITALS — BP 132/88 | HR 81 | Temp 98.7°F | Ht 71.06 in | Wt 251.0 lb

## 2021-02-22 DIAGNOSIS — Z23 Encounter for immunization: Secondary | ICD-10-CM | POA: Diagnosis not present

## 2021-02-22 DIAGNOSIS — L0390 Cellulitis, unspecified: Secondary | ICD-10-CM

## 2021-02-22 DIAGNOSIS — S81811A Laceration without foreign body, right lower leg, initial encounter: Secondary | ICD-10-CM | POA: Diagnosis not present

## 2021-02-22 LAB — BAYER DCA HB A1C WAIVED: HB A1C (BAYER DCA - WAIVED): 6 % (ref <7.0)

## 2021-02-22 MED ORDER — CEPHALEXIN 500 MG PO CAPS: 500.0000 mg | ORAL_CAPSULE | Freq: Two times a day (BID) | ORAL | 0 refills | Status: AC
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
## 2021-02-23 LAB — CBC WITH DIFFERENTIAL
Basophils Absolute: 0 10*3/uL (ref 0.0–0.2)
Basos: 1 %
EOS (ABSOLUTE): 0.1 10*3/uL (ref 0.0–0.4)
Eos: 2 %
Hematocrit: 42.1 % (ref 37.5–51.0)
Hemoglobin: 14.3 g/dL (ref 13.0–17.7)
Immature Grans (Abs): 0.1 10*3/uL (ref 0.0–0.1)
Immature Granulocytes: 1 %
Lymphocytes Absolute: 1.6 10*3/uL (ref 0.7–3.1)
Lymphs: 22 %
MCH: 31.8 pg (ref 26.6–33.0)
MCHC: 34 g/dL (ref 31.5–35.7)
MCV: 94 fL (ref 79–97)
Monocytes Absolute: 0.7 10*3/uL (ref 0.1–0.9)
Monocytes: 10 %
Neutrophils Absolute: 4.5 10*3/uL (ref 1.4–7.0)
Neutrophils: 64 %
RBC: 4.49 x10E6/uL (ref 4.14–5.80)
RDW: 13 % (ref 11.6–15.4)
WBC: 7 10*3/uL (ref 3.4–10.8)

## 2021-02-28 LAB — ANAEROBIC AND AEROBIC CULTURE

## 2021-03-01 ENCOUNTER — Other Ambulatory Visit: Payer: Self-pay | Admitting: Internal Medicine

## 2021-03-01 ENCOUNTER — Other Ambulatory Visit: Payer: Self-pay

## 2021-03-01 MED ORDER — FLUCONAZOLE 200 MG PO TABS: 400.0000 mg | ORAL_TABLET | Freq: Every day | ORAL | 0 refills | Status: AC

## 2021-03-01 NOTE — Progress Notes (Signed)
Has candida heavy growth from Wound cultures, will need to start him on diflucan po x 10 days please let him know thnx. Sending diflucan 400 mg once a day x 7 days to pharmacy please let pt know need to recheck wound culture x 1 week set up appotinemtn x 1 week please thnx

## 2021-03-01 NOTE — Progress Notes (Signed)
LVM for patient to return call and to inform patient that medication has been sent to [facility].

## 2021-04-08 DIAGNOSIS — M899 Disorder of bone, unspecified: Secondary | ICD-10-CM | POA: Insufficient documentation

## 2021-04-08 DIAGNOSIS — G894 Chronic pain syndrome: Secondary | ICD-10-CM | POA: Insufficient documentation

## 2021-04-08 DIAGNOSIS — Z79899 Other long term (current) drug therapy: Secondary | ICD-10-CM | POA: Insufficient documentation

## 2021-04-08 DIAGNOSIS — Z789 Other specified health status: Secondary | ICD-10-CM | POA: Insufficient documentation

## 2021-04-08 NOTE — Progress Notes (Deleted)
No-show to initial evaluation on 04/09/2021. 

## 2021-04-09 ENCOUNTER — Ambulatory Visit: Payer: Self-pay | Admitting: Pain Medicine

## 2021-04-09 DIAGNOSIS — M899 Disorder of bone, unspecified: Secondary | ICD-10-CM

## 2021-04-09 DIAGNOSIS — Z79899 Other long term (current) drug therapy: Secondary | ICD-10-CM

## 2021-04-09 DIAGNOSIS — Z789 Other specified health status: Secondary | ICD-10-CM

## 2021-04-09 DIAGNOSIS — G894 Chronic pain syndrome: Secondary | ICD-10-CM

## 2021-09-13 ENCOUNTER — Telehealth: Payer: Self-pay | Admitting: Internal Medicine

## 2021-09-13 NOTE — Telephone Encounter (Signed)
Copied from [place] [id number]. Topic: Medicare AWV >> Sep 13, 2021 10:44 AM Lavonia Drafts wrote: Reason for CRM: Left message for patient to call back and schedule Medicare Annual Wellness Visit (AWV) to be done virtually or by telephone.  No hx of AWV eligible as of 09/09/21  Please schedule at anytime with [facility]-Nurse Health Advisor.      39 Minutes appointment   Any questions, please call me at [phone]

## 2021-10-03 ENCOUNTER — Telehealth: Payer: Self-pay | Admitting: *Deleted

## 2021-10-03 ENCOUNTER — Encounter: Payer: Self-pay | Admitting: Internal Medicine

## 2021-10-03 ENCOUNTER — Ambulatory Visit
Admission: RE | Admit: 2021-10-03 | Discharge: 2021-10-03 | Disposition: A | Discharge status: Home / Self Care | Payer: Medicare Other | Source: Ambulatory Visit | Attending: Internal Medicine | Admitting: Internal Medicine

## 2021-10-03 ENCOUNTER — Ambulatory Visit (INDEPENDENT_AMBULATORY_CARE_PROVIDER_SITE_OTHER): Payer: Medicare Other | Admitting: Internal Medicine

## 2021-10-03 ENCOUNTER — Ambulatory Visit
Admission: RE | Admit: 2021-10-03 | Discharge: 2021-10-03 | Disposition: A | Discharge status: Home / Self Care | Payer: Medicare Other | Attending: Internal Medicine | Admitting: Internal Medicine

## 2021-10-03 ENCOUNTER — Other Ambulatory Visit: Payer: Self-pay

## 2021-10-03 VITALS — BP 114/71 | HR 67 | Temp 98.8°F | Ht 71.26 in | Wt 255.6 lb

## 2021-10-03 DIAGNOSIS — M545 Low back pain, unspecified: Secondary | ICD-10-CM

## 2021-10-03 DIAGNOSIS — G8929 Other chronic pain: Secondary | ICD-10-CM | POA: Diagnosis not present

## 2021-10-03 DIAGNOSIS — R34 Anuria and oliguria: Secondary | ICD-10-CM | POA: Insufficient documentation

## 2021-10-03 DIAGNOSIS — F1010 Alcohol abuse, uncomplicated: Secondary | ICD-10-CM | POA: Diagnosis not present

## 2021-10-03 DIAGNOSIS — M47816 Spondylosis without myelopathy or radiculopathy, lumbar region: Secondary | ICD-10-CM | POA: Diagnosis not present

## 2021-10-03 DIAGNOSIS — R339 Retention of urine, unspecified: Secondary | ICD-10-CM | POA: Diagnosis not present

## 2021-10-03 LAB — URINALYSIS, ROUTINE W REFLEX MICROSCOPIC
Bilirubin, UA: NEGATIVE
Glucose, UA: NEGATIVE
Ketones, UA: NEGATIVE
Leukocytes,UA: NEGATIVE
Nitrite, UA: NEGATIVE
Protein,UA: NEGATIVE
RBC, UA: NEGATIVE
Specific Gravity, UA: >1.03 — ABNORMAL HIGH (ref 1.005–1.030)
Urobilinogen, Ur: 0.2 mg/dL (ref 0.2–1.0)
pH, UA: 5 (ref 5.0–7.5)

## 2021-10-03 NOTE — Patient Instructions (Signed)
Alcohol Withdrawal Syndrome When a person who drinks a lot of alcohol stops drinking, he or she may have unpleasant and serious symptoms. These symptoms are called alcohol withdrawal syndrome. This condition may be mild or severe. It can be life-threatening. This condition can cause: Shaking that you cannot control (tremor). Sweating. Headache. Feeling fearful, upset, grouchy, or depressed. Trouble sleeping (insomnia). Nightmares. Fast or uneven heartbeats (palpitations). Alcohol cravings. Feeling sick to your stomach (nausea). Throwing up (vomiting). Being bothered by light and sounds. Confusion. Trouble thinking clearly. Not being hungry (loss of appetite). Big changes in mood (mood swings). If you have all of the following symptoms at the same time, get help right away: High blood pressure. Fast heartbeat. Trouble breathing. Seizures. Seeing, hearing, feeling, smelling, or tasting things that are not there (hallucinations). These symptoms are known as delirium tremens (DTs). They must be treated at thehospital right away. Follow these instructions at home:  Take over-the-counter and prescription medicines only as told by your doctor. This includes vitamins. Do not drink alcohol. Do not drive until your doctor says that this is safe for you. Have someone stay with you or be available in case you need help. This should be someone you trust. This person can help you with your symptoms. He or she can also help you to not drink. Drink enough fluid to keep your pee (urine) pale yellow. Think about joining a support group or a treatment program to help you stop drinking. Keep all follow-up visits as told by your doctor. This is important. Contact a doctor if: Your symptoms get worse. You cannot eat or drink without throwing up. You have a hard time not drinking alcohol. You cannot stop drinking alcohol. Get help right away if: You have fast or uneven heartbeats. You have chest  pain. You have trouble breathing. You have a seizure for the first time. You see, hear, feel, smell, or taste something that is not there. You get very confused. Summary When a person who drinks a lot of alcohol stops drinking, he or she may have serious symptoms. This is called alcohol withdrawal syndrome. Delirium tremens (DTs) is a group of life-threatening symptoms. You should get help right away if you have these symptoms. Think about joining an alcohol support group or a treatment program. This information is not intended to replace advice given to you by your health care provider. Make sure you discuss any questions you have with your healthcare provider. Document Revised: 07/17/2020 Document Reviewed: 07/17/2020 Elsevier Patient Education  2022 Elsevier Inc.  

## 2021-10-03 NOTE — Progress Notes (Signed)
BP 114/71    Pulse 67    Temp 98.8 F (37.1 C) (Oral)    Ht 5' 11.26" (1.81 m)    Wt 255 lb 9.6 oz (115.9 kg)    SpO2 97%    BMI 35.39 kg/m    Subjective:    Patient ID: Mark Sexton, male    DOB: May 29, 1956, 66 y.o.   MRN: [id number]  Chief Complaint  Patient presents with   Urinary Retention    When he does go he only goes a little. Started a few days ago.    Knee Pain   Back Pain    Has been having pain in the area of his Kidney's.     HPI: Mark Sexton is a 66 y.o. male  Patient presents with: Urinary Retention: When he does go he only goes a little. Started a few days ago. Knee Pain Back Pain: Has been having pain in the area of his Kidney's.   Co decreased amount of urine, has been drinking water about 4 16 oz bottles per his verbal record.  No hematuria, dysuria. Has quit alcohol recently.  Is concerned about such. Says has been drinking too much x 1 yr 18 pack a day  of beer since last year x 1  Cut back x 1 week now Stopped cold [place], has had night sweats since he quit. Denies tremors.     Knee Pain  The incident occurred more than 1 week ago. The quality of the pain is described as aching. The pain is at a severity of 6/10. Pertinent negatives include no inability to bear weight, loss of motion, loss of sensation, muscle weakness, numbness or tingling. He has tried acetaminophen for the symptoms. The treatment provided moderate relief.  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 Pertinent negatives include no numbness or tingling.   Chief Complaint  Patient presents with   Urinary Retention    When he does go he only goes a little. Started a few days ago.    Knee Pain   Back Pain    Has been having pain in the area of his Kidney's.     Relevant past medical, surgical, family and social history reviewed and updated as indicated. Interim medical history since our last  visit reviewed. Allergies and medications reviewed and updated.  Review of Systems  Musculoskeletal:  Positive for back pain.  Neurological:  Negative for tingling and numbness.   Per HPI unless specifically indicated above     Objective:    BP 114/71    Pulse 67    Temp 98.8 F (37.1 C) (Oral)    Ht 5' 11.26" (1.81 m)    Wt 255 lb 9.6 oz (115.9 kg)    SpO2 97%    BMI 35.39 kg/m   Wt Readings from Last 3 Encounters:  10/03/21 255 lb 9.6 oz (115.9 kg)  02/22/21 251 lb (113.9 kg)  01/08/21 255 lb 6.4 oz (115.8 kg)    Physical Exam Vitals and nursing note reviewed.  Constitutional:      General: He is not in acute distress.    Appearance: Normal appearance. He is not ill-appearing or diaphoretic.  HENT:     Head: Normocephalic and atraumatic.     Right Ear: Tympanic membrane and external ear normal. There is no impacted cerumen.     Left Ear:  External ear normal.     Nose: No congestion or rhinorrhea.     Mouth/Throat:     Pharynx: No oropharyngeal exudate or posterior oropharyngeal erythema.  Eyes:     Conjunctiva/sclera: Conjunctivae normal.     Pupils: Pupils are equal, round, and reactive to light.  Cardiovascular:     Rate and Rhythm: Normal rate and regular rhythm.     Heart sounds: No murmur heard.   No friction rub. No gallop.  Pulmonary:     Effort: No respiratory distress.     Breath sounds: No stridor. No wheezing or rhonchi.  Chest:     Chest wall: No tenderness.  Abdominal:     General: Abdomen is flat. Bowel sounds are normal.     Palpations: Abdomen is soft. There is no mass.     Tenderness: There is no abdominal tenderness.  Musculoskeletal:     Cervical back: Normal range of motion and neck supple. No rigidity or tenderness.     Left lower leg: No edema.  Skin:    General: Skin is warm and dry.  Neurological:     Mental Status: He is alert.    Results for orders placed or performed in visit on 02/22/21  Anaerobic and Aerobic Culture   Specimen:  Ear, Right; Sterile Swab   WO  Result Value Ref Range   Anaerobic Culture Final report    Result 1 Comment    Aerobic Culture Final report (A)    Result 1 Routine flora    Result 2 Candida parapsilosis (A)   CBC With Differential (STAT)  Result Value Ref Range   WBC 7.0 3.4 - 10.8 x10E3/uL   RBC 4.49 4.14 - 5.80 x10E6/uL   Hemoglobin 14.3 13.0 - 17.7 g/dL   Hematocrit 42.1 37.5 - 51.0 %   MCV 94 79 - 97 fL   MCH 31.8 26.6 - 33.0 pg   MCHC 34.0 31.5 - 35.7 g/dL   RDW 13.0 11.6 - 15.4 %   Neutrophils 64 Not Estab. %   Lymphs 22 Not Estab. %   Monocytes 10 Not Estab. %   Eos 2 Not Estab. %   Basos 1 Not Estab. %   Neutrophils Absolute 4.5 1.4 - 7.0 x10E3/uL   Lymphocytes Absolute 1.6 0.7 - 3.1 x10E3/uL   Monocytes Absolute 0.7 0.1 - 0.9 x10E3/uL   EOS (ABSOLUTE) 0.1 0.0 - 0.4 x10E3/uL   Basophils Absolute 0.0 0.0 - 0.2 x10E3/uL   Immature Granulocytes 1 Not Estab. 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  Chondrocalcinosis question CPPD. Needs to see ortho  has had shots in knees for such  Chronic worsening.   Back pain :check xrays of the L spine.   Urinary problem ? Sec to BPH check UA , check KUB  Will refer to urology. Need further eval Advised to call the office or go to the ER if he develops worsneing abdominal pain/ is suffering from alcohol withdrawal symptomlisted on AVS .  Pt verbalized understanding of such.  Etoh abuse :  Refer to CCM team - Social worker for resources and counseling for ETOH abuse. To go to the ER if worsens.   Problem List Items Addressed This Visit        Other   Chronic bilateral low back pain without sciatica - Primary   Relevant Orders   DG Lumbar Spine Complete   Ambulatory referral to Orthopedics   Urinalysis, Routine w reflex microscopic   Decreased urine output   Relevant Orders   CBC with Differential/Platelet   Comprehensive metabolic panel   PSA   Ambulatory referral to Urology   DG Abd 1 View   Alcohol abuse   Relevant Orders   AMB Referral to Community Care Coordinaton     Orders Placed This Encounter  Procedures   DG Lumbar Spine Complete   DG Abd 1 View   Urinalysis, Routine w reflex microscopic   CBC with Differential/Platelet   Comprehensive metabolic panel   PSA   Ambulatory referral to Orthopedics   AMB Referral to [facility]   Ambulatory referral to Urology     No orders of the defined types were placed in this encounter.    Follow up plan: Return in about 2 weeks (around 10/17/2021).

## 2021-10-03 NOTE — Telephone Encounter (Signed)
[facility] Call report: Radiology is calling to verify X-ray report is in chart- follow up is recommended: IMPRESSION: 1. 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 2. Grade 2 anterolisthesis of L5 on S1 with associated pars defects and significant posterior wedge deformity with up to approximately 50 percent loss of vertebral body height. This is also likely progressed since 2010. 3. Dextroscoliosis centered at L2. 4. Correlate with patient's symptoms/point tenderness, and consider CT or MRI for further evaluation.

## 2021-10-04 LAB — CBC WITH DIFFERENTIAL/PLATELET
Basophils Absolute: 0.1 10*3/uL (ref 0.0–0.2)
Basos: 1 %
EOS (ABSOLUTE): 0.2 10*3/uL (ref 0.0–0.4)
Eos: 4 %
Hematocrit: 41.1 % (ref 37.5–51.0)
Hemoglobin: 14.4 g/dL (ref 13.0–17.7)
Immature Grans (Abs): 0 10*3/uL (ref 0.0–0.1)
Immature Granulocytes: 1 %
Lymphocytes Absolute: 1.9 10*3/uL (ref 0.7–3.1)
Lymphs: 31 %
MCH: 32.5 pg (ref 26.6–33.0)
MCHC: 35 g/dL (ref 31.5–35.7)
MCV: 93 fL (ref 79–97)
Monocytes Absolute: 0.8 10*3/uL (ref 0.1–0.9)
Monocytes: 13 %
Neutrophils Absolute: 3.2 10*3/uL (ref 1.4–7.0)
Neutrophils: 50 %
Platelets: 206 10*3/uL (ref 150–450)
RBC: 4.43 x10E6/uL (ref 4.14–5.80)
RDW: 12.1 % (ref 11.6–15.4)
WBC: 6.3 10*3/uL (ref 3.4–10.8)

## 2021-10-04 LAB — COMPREHENSIVE METABOLIC PANEL
ALT: 75 IU/L — ABNORMAL HIGH (ref 0–44)
AST: 36 IU/L (ref 0–40)
Albumin/Globulin Ratio: 2.3 — ABNORMAL HIGH (ref 1.2–2.2)
Albumin: 4.4 g/dL (ref 3.8–4.8)
Alkaline Phosphatase: 75 IU/L (ref 44–121)
BUN/Creatinine Ratio: 15 (ref 10–24)
BUN: 16 mg/dL (ref 8–27)
Bilirubin Total: 0.5 mg/dL (ref 0.0–1.2)
CO2: 22 mmol/L (ref 20–29)
Calcium: 9.4 mg/dL (ref 8.6–10.2)
Chloride: 103 mmol/L (ref 96–106)
Creatinine, Ser: 1.04 mg/dL (ref 0.76–1.27)
Globulin, Total: 1.9 g/dL (ref 1.5–4.5)
Glucose: 99 mg/dL (ref 70–99)
Potassium: 3.8 mmol/L (ref 3.5–5.2)
Sodium: 140 mmol/L (ref 134–144)
Total Protein: 6.3 g/dL (ref 6.0–8.5)
eGFR: 79 mL/min/{1.73_m2} (ref >59)

## 2021-10-04 LAB — PSA: Prostate Specific Ag, Serum: 3 ng/mL (ref 0.0–4.0)

## 2021-10-08 ENCOUNTER — Telehealth: Payer: Self-pay

## 2021-10-08 NOTE — Chronic Care Management (AMB) (Signed)
°  Care Management   Note  10/08/2021 Name: Mark Sexton MRN: [id number] DOB: Nov 27, 1955  Mark Sexton is a 66 y.o. year old male who is a primary care patient of Vigg, Avanti, MD. I reached out to Koen D Dunker by phone today in response to a referral sent by Mark Sexton's primary care provider.   Mark Sexton was given information about care management services today including:  Care management services include personalized support from designated clinical staff supervised by his physician, including individualized plan of care and coordination with other care providers 24/7 contact phone numbers for assistance for urgent and routine care needs. The patient may stop care management services at any time by phone call to the office staff.  Patient agreed to services and verbal consent obtained.   Follow up plan: Patient declines engagement by the care management team. Appropriate care team members and provider have been notified via electronic communication.   Penne Lash, RMA Care Guide, Embedded Care Coordination [facility]  [place], [place] [address] Direct Dial: [phone] [email or web address]

## 2021-10-09 ENCOUNTER — Other Ambulatory Visit: Payer: Self-pay | Admitting: *Deleted

## 2021-10-09 ENCOUNTER — Ambulatory Visit (INDEPENDENT_AMBULATORY_CARE_PROVIDER_SITE_OTHER): Payer: Medicare Other | Admitting: Urology

## 2021-10-09 ENCOUNTER — Telehealth: Payer: Self-pay

## 2021-10-09 ENCOUNTER — Other Ambulatory Visit: Payer: Self-pay

## 2021-10-09 ENCOUNTER — Encounter: Payer: Self-pay | Admitting: Urology

## 2021-10-09 ENCOUNTER — Other Ambulatory Visit
Admission: RE | Admit: 2021-10-09 | Discharge: 2021-10-09 | Disposition: A | Discharge status: Home / Self Care | Payer: Medicare Other | Attending: Urology | Admitting: Urology

## 2021-10-09 VITALS — BP 147/71 | HR 77 | Ht 72.0 in | Wt 252.0 lb

## 2021-10-09 DIAGNOSIS — R3129 Other microscopic hematuria: Secondary | ICD-10-CM

## 2021-10-09 DIAGNOSIS — R34 Anuria and oliguria: Secondary | ICD-10-CM | POA: Insufficient documentation

## 2021-10-09 LAB — URINALYSIS, COMPLETE (UACMP) WITH MICROSCOPIC
Glucose, UA: NEGATIVE mg/dL
Hgb urine dipstick: NEGATIVE
Leukocytes,Ua: NEGATIVE
Nitrite: NEGATIVE
Protein, ur: NEGATIVE mg/dL
Specific Gravity, Urine: >1.03 — ABNORMAL HIGH (ref 1.005–1.030)
pH: 5 (ref 5.0–8.0)

## 2021-10-09 LAB — BLADDER SCAN AMB NON-IMAGING

## 2021-10-09 NOTE — Telephone Encounter (Signed)
Called pt informed him of the information below. Pt voiced understanding. Cysto scheduled. CT ordered.

## 2021-10-09 NOTE — Telephone Encounter (Signed)
-----   Message from Billey Co, MD sent at 10/09/2021 12:53 PM EST ----- There was a small amount of blood on urine sample.  Would recommend CT hematuria and cystoscopy, please review cystoscopy procedure and order imaging and schedule cystoscopy  Nickolas Madrid, MD 10/09/2021

## 2021-10-09 NOTE — Progress Notes (Signed)
° °  10/09/21 12:35 PM   Alder D Beckom [birth year] [id number]  CC: Decreased urinary output  HPI: 66 year old male who recently quit drinking alcohol 1 week ago who reports 1 week of decreased urine output.  He previously was drinking 18+ beers per day, and sounds like he had a fair amount of urinary frequency and nocturia.  Since stopping alcohol cold [place] last week, he reports his urination has decreased to 2-3 times per day, and once overnight.  He denies any problems with urination.  PSA was normal with PCP at 3 on 10/03/2021 and dipstick urinalysis was benign.  Urinalysis today is pending.  He denies any dysuria or gross hematuria.  PVR is normal today at 0 mL.   PMH: Past Medical History:  Diagnosis Date   Allergy    Arthritis    Family History: Family History  Problem Relation Age of Onset   Cancer Mother    Birth defects Maternal Grandmother     Social History:  reports that he quit smoking about 22 months ago. His smoking use included cigarettes. He has never used smokeless tobacco. He reports current alcohol use. He reports current drug use. Drug: Oxycodone.  Physical Exam: BP (!) 147/71    Pulse 77    Ht 6' (1.829 m)    Wt 252 lb (114.3 kg)    BMI 34.18 kg/m    Constitutional:  Alert and oriented, No acute distress. Cardiovascular: No clubbing, cyanosis, or edema. Respiratory: Normal respiratory effort, no increased work of breathing. GI: Abdomen is soft, nontender, nondistended, no abdominal masses   Laboratory Data: Reviewed, see HPI  Assessment & Plan:   66 year old male who was previously a very heavy drinker at least 18 beers per day who quit cold [place] last week, and has noticed decreased urine output.  Urinalysis and PSA with PCP were benign, and PVR is normal today.  Reassurance was provided that this is likely related to his significant decrease in alcohol intake and overall fluid volume.  Will call with urinalysis results Follow-up with urology as  needed normal  Nickolas Madrid, MD 10/09/2021  [facility] [address], [place], [place] [address] [phone]

## 2021-10-12 ENCOUNTER — Telehealth (INDEPENDENT_AMBULATORY_CARE_PROVIDER_SITE_OTHER): Payer: Medicare Other | Admitting: Internal Medicine

## 2021-10-12 ENCOUNTER — Encounter: Payer: Self-pay | Admitting: Internal Medicine

## 2021-10-12 ENCOUNTER — Telehealth: Payer: Self-pay

## 2021-10-12 DIAGNOSIS — F1010 Alcohol abuse, uncomplicated: Secondary | ICD-10-CM | POA: Diagnosis not present

## 2021-10-12 DIAGNOSIS — G894 Chronic pain syndrome: Secondary | ICD-10-CM | POA: Diagnosis not present

## 2021-10-12 DIAGNOSIS — M439 Deforming dorsopathy, unspecified: Secondary | ICD-10-CM

## 2021-10-12 MED ORDER — LIDOCAINE 5 % EX PTCH: 1.0000 | MEDICATED_PATCH | CUTANEOUS | 0 refills | Status: DC

## 2021-10-12 MED ORDER — DICLOFENAC SODIUM 75 MG PO TBEC: 75.0000 mg | DELAYED_RELEASE_TABLET | Freq: Two times a day (BID) | ORAL | 1 refills | Status: DC

## 2021-10-12 MED ORDER — TRAMADOL HCL 50 MG PO TABS
50.0000 mg | ORAL_TABLET | Freq: Four times a day (QID) | ORAL | 0 refills | Status: AC | PRN
Start: 2021-10-12 — End: 2021-10-19

## 2021-10-12 NOTE — Telephone Encounter (Signed)
PA started for Lidocaine 5% patches, through [employer]. Awaiting on determination

## 2021-10-12 NOTE — Progress Notes (Addendum)
There were no vitals taken for this visit.   Subjective:    Patient ID: Mark Sexton, male    DOB: [birth year], 66 y.o.   MRN: [id number]  No chief complaint on file.   HPI: Mark Sexton is a 66 y.o. male  Eoth abuse hasnt had any drinks x 1 week now. Stopped cold turkye isnt symptomatic.   Back Pain This is a chronic (in the thoracic / upper lumbar areas , still persists, xray abnl as below, pt co mod  pain, deneis sciatica) problem. The pain is at a severity of 7/10. The pain is The same all the time. Pertinent negatives include no abdominal pain, bladder incontinence, bowel incontinence, chest pain, dysuria, fever, headaches, leg pain, numbness, paresis, paresthesias, pelvic pain, perianal numbness, tingling, weakness or weight loss. Risk factors: has a ho falls in the past nothign recent. He has tried NSAIDs for the symptoms.   No chief complaint on file.   Relevant past medical, surgical, family and social history reviewed and updated as indicated. Interim medical history since our last visit reviewed. Allergies and medications reviewed and updated.  Review of Systems  Constitutional:  Negative for fever and weight loss.  Cardiovascular:  Negative for chest pain.  Gastrointestinal:  Negative for abdominal pain and bowel incontinence.  Genitourinary:  Negative for bladder incontinence, dysuria and pelvic pain.  Musculoskeletal:  Positive for back pain.  Neurological:  Negative for tingling, weakness, numbness, headaches and paresthesias.   Per HPI unless specifically indicated above     Objective:    There were no vitals taken for this visit.  Wt Readings from Last 3 Encounters:  10/09/21 252 lb (114.3 kg)  10/03/21 255 lb 9.6 oz (115.9 kg)  02/22/21 251 lb (113.9 kg)    Physical Exam  Results for orders placed or performed during the hospital encounter of 10/09/21  Urinalysis, Complete w Microscopic  Result Value Ref Range   Color, Urine YELLOW YELLOW    APPearance CLEAR CLEAR   Specific Gravity, Urine >1.030 (H) 1.005 - 1.030   pH 5.0 5.0 - 8.0   Glucose, UA NEGATIVE NEGATIVE mg/dL   Hgb urine dipstick NEGATIVE NEGATIVE   Bilirubin Urine SMALL (A) NEGATIVE   Ketones, ur TRACE (A) NEGATIVE mg/dL   Protein, ur NEGATIVE NEGATIVE mg/dL   Nitrite NEGATIVE NEGATIVE   Leukocytes,Ua NEGATIVE NEGATIVE   Squamous Epithelial / LPF 0-5 0 - 5   WBC, UA 0-5 0 - 5 WBC/hpf   RBC / HPF 11-20 0 - 5 RBC/hpf   Bacteria, UA RARE (A) NONE SEEN   Mucus PRESENT    Ca Oxalate Crys, UA PRESENT         Current Outpatient Medications:    buprenorphine-naloxone (SUBOXONE) 2-0.5 mg SUBL SL tablet, Place under the tongue., Disp: , Rfl:    1. 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 2. Grade 2 anterolisthesis of L5 on S1 with associated pars defects and significant posterior wedge deformity with up to approximately 50 percent loss of vertebral body height. This is also likely progressed since 2010. 3. Dextroscoliosis centered at L2. 4. Correlate with patient's symptoms/point tenderness, and consider CT or MRI for further evaluation.  Assessment & Plan:  Compression fractures of the L1 - L2 vertebrae as noted above.  Will start pt on volatern and tramadol for ac pain  Start  Lidoderm patches on for 12 and off for 12 hrs.  Refer to orthopedic surg for the above Will need pain mx.    Etoh was drinking 12 pack a day of beer  ALT sklighlty elevated  Now stopped cold [place] x 1 week now.    Problem List Items Addressed This Visit   None    No orders of the defined types were placed in this encounter.    No orders of the defined types were placed in this encounter.    Follow up plan: No follow-ups on file.  This visit was completed via telephone due to the restrictions of the COVID-19 pandemic. All issues as above  were discussed and addressed but no physical exam was performed. If it was felt that the patient should be evaluated in the office, they were directed there. The patient verbally consented to this visit. Patient was unable to complete an audio/visual visit due to Technical difficulties, Lack of internet. Due to the catastrophic nature of the COVID-19 pandemic, this visit was done through audio contact only. Location of the patient: home Location of the provider: work Those involved with this call:  Provider: Charlynne Cousins, MD CMA: Frazier Butt, [place] Desk/Registration: Myrlene Broker  Time spent on call:  15 minutes on the phone discussing health concerns. 15 minutes total spent in review of patient's record and preparation of their chart.

## 2021-10-12 NOTE — Telephone Encounter (Signed)
Scheduled virtual appt today at 11:20

## 2021-10-16 ENCOUNTER — Telehealth: Payer: Self-pay | Admitting: Internal Medicine

## 2021-10-16 NOTE — Telephone Encounter (Signed)
Appt scheduled

## 2021-10-16 NOTE — Telephone Encounter (Signed)
Cancelled.  

## 2021-10-16 NOTE — Telephone Encounter (Signed)
Patient wants to confirm if appt for 02/08, tomorrow will be cancelled, because he says he was told by Dr Charlotta Newton,  he needs to do an MRI first,

## 2021-10-17 ENCOUNTER — Ambulatory Visit: Payer: Medicare Other | Admitting: Internal Medicine

## 2021-10-26 DIAGNOSIS — M5416 Radiculopathy, lumbar region: Secondary | ICD-10-CM | POA: Insufficient documentation

## 2021-10-30 ENCOUNTER — Other Ambulatory Visit: Payer: Self-pay

## 2021-10-30 ENCOUNTER — Other Ambulatory Visit: Payer: Medicare Other | Admitting: Urology

## 2021-11-02 ENCOUNTER — Ambulatory Visit: Payer: Medicare Other | Admitting: Internal Medicine

## 2021-11-09 ENCOUNTER — Ambulatory Visit: Admission: RE | Admit: 2021-11-09 | Payer: Medicare Other | Source: Ambulatory Visit

## 2021-11-13 ENCOUNTER — Other Ambulatory Visit: Payer: Self-pay

## 2021-11-13 ENCOUNTER — Ambulatory Visit
Admission: RE | Admit: 2021-11-13 | Discharge: 2021-11-13 | Disposition: A | Discharge status: Home / Self Care | Payer: Medicare Other | Source: Ambulatory Visit | Attending: Urology | Admitting: Urology

## 2021-11-13 ENCOUNTER — Other Ambulatory Visit: Payer: Medicare Other | Admitting: Urology

## 2021-11-13 DIAGNOSIS — R3129 Other microscopic hematuria: Secondary | ICD-10-CM | POA: Diagnosis not present

## 2021-11-13 DIAGNOSIS — N3289 Other specified disorders of bladder: Secondary | ICD-10-CM | POA: Diagnosis not present

## 2021-11-13 DIAGNOSIS — N281 Cyst of kidney, acquired: Secondary | ICD-10-CM | POA: Diagnosis not present

## 2021-11-13 LAB — POCT I-STAT CREATININE: Creatinine, Ser: 1.3 mg/dL — ABNORMAL HIGH (ref 0.61–1.24)

## 2021-11-13 MED ORDER — IOHEXOL 300 MG/ML  SOLN
100.0000 mL | Freq: Once | INTRAMUSCULAR | Status: AC | PRN
  Administered 2021-11-13: 100 mL via INTRAVENOUS

## 2021-11-20 ENCOUNTER — Encounter: Payer: Self-pay | Admitting: Urology

## 2021-11-20 ENCOUNTER — Ambulatory Visit (INDEPENDENT_AMBULATORY_CARE_PROVIDER_SITE_OTHER): Payer: Medicare Other | Admitting: Urology

## 2021-11-20 ENCOUNTER — Other Ambulatory Visit: Payer: Self-pay

## 2021-11-20 VITALS — BP 138/79 | HR 80 | Ht 72.0 in | Wt 250.0 lb

## 2021-11-20 DIAGNOSIS — R3121 Asymptomatic microscopic hematuria: Secondary | ICD-10-CM

## 2021-11-20 NOTE — Progress Notes (Signed)
? ?  11/20/2021 ?10:01 AM  ? ?Mark Sexton ?01/19/1956 ?[id number] ? ?Patient was scheduled today for cystoscopy for completion of microscopic hematuria work-up, UA 10/09/2021 showed 11-20 RBCs but otherwise benign. ? ?CT urogram 11/13/2021 with no significant urologic abnormalities ? ?Patient refused cystoscopy today.  We reviewed the AUA guidelines that recommend CT and cystoscopy for patients in high risk category for microscopic hematuria based on his age and smoking history, and he understands the risks of missing additional pathology including bladder cancer.  Return precautions were discussed extensively including gross hematuria.  He prefers to follow-up with urology as needed. ? ?Sondra Come, MD ? ?[facility] ?[address], [address] ?[place], [place] [address] ?[phone] ? ?

## 2021-11-23 ENCOUNTER — Ambulatory Visit (INDEPENDENT_AMBULATORY_CARE_PROVIDER_SITE_OTHER): Payer: Medicare Other | Admitting: Internal Medicine

## 2021-11-23 ENCOUNTER — Other Ambulatory Visit: Payer: Self-pay

## 2021-11-23 ENCOUNTER — Encounter: Payer: Self-pay | Admitting: Internal Medicine

## 2021-11-23 VITALS — BP 120/82 | HR 72 | Temp 98.5°F | Ht 71.26 in | Wt 254.2 lb

## 2021-11-23 DIAGNOSIS — S32000D Wedge compression fracture of unspecified lumbar vertebra, subsequent encounter for fracture with routine healing: Secondary | ICD-10-CM

## 2021-11-23 NOTE — Progress Notes (Signed)
? ?BP 120/82   Pulse 72   Temp 98.5 ?F (36.9 ?C) (Oral)   Ht 5' 11.26" (1.81 m)   Wt 254 lb 3.2 oz (115.3 kg)   SpO2 96%   BMI 35.20 kg/m?   ? ?Subjective:  ? ? Patient ID: Mark Sexton, male    DOB: 05/21/1956, 66 y.o.   MRN: [id number] ? ?Chief Complaint  ?Patient presents with  ? Back pain  ?  Did not have MRI because MRI was for lower back only and not the entire back Patient states that lower back pain has resolved after Medrol dose pack  ? Hematuria  ?  Had blood in urine about 2 months ago, wants to have rechecked.   ? ? ?HPI: ?Mark Sexton is a 66 y.o. male ? ?Hematuria ?This is a chronic (urine showed microscopic hematuria ex smoker - stopped 2 yr ago) problem.  ?Back Pain ?This is a chronic (hsa compression fracture of the lower back hasnt seen ortho for such) problem. The pain is present in the lumbar spine. The pain is at a severity of 5/10. The pain is moderate.  ? ?Chief Complaint  ?Patient presents with  ? Back pain  ?  Did not have MRI because MRI was for lower back only and not the entire back Patient states that lower back pain has resolved after Medrol dose pack  ? Hematuria  ?  Had blood in urine about 2 months ago, wants to have rechecked.   ? ? ?Relevant past medical, surgical, family and social history reviewed and updated as indicated. Interim medical history since our last visit reviewed. ?Allergies and medications reviewed and updated. ? ?Review of Systems  ?Genitourinary:  Positive for hematuria.  ?Musculoskeletal:  Positive for back pain.  ? ?Per HPI unless specifically indicated above ? ?   ?Objective:  ?  ?BP 120/82   Pulse 72   Temp 98.5 ?F (36.9 ?C) (Oral)   Ht 5' 11.26" (1.81 m)   Wt 254 lb 3.2 oz (115.3 kg)   SpO2 96%   BMI 35.20 kg/m?   ?Wt Readings from Last 3 Encounters:  ?11/23/21 254 lb 3.2 oz (115.3 kg)  ?11/20/21 250 lb (113.4 kg)  ?10/09/21 252 lb (114.3 kg)  ?  ?Physical Exam ?Vitals and nursing note reviewed.  ?Constitutional:   ?   General: He is not in acute  distress. ?   Appearance: Normal appearance. He is not ill-appearing or diaphoretic.  ?HENT:  ?   Head: Normocephalic and atraumatic.  ?   Right Ear: Tympanic membrane and external ear normal. There is no impacted cerumen.  ?   Left Ear: External ear normal.  ?   Nose: No congestion or rhinorrhea.  ?   Mouth/Throat:  ?   Pharynx: No oropharyngeal exudate or posterior oropharyngeal erythema.  ?Eyes:  ?   Conjunctiva/sclera: Conjunctivae normal.  ?   Pupils: Pupils are equal, round, and reactive to light.  ?Cardiovascular:  ?   Rate and Rhythm: Normal rate and regular rhythm.  ?   Heart sounds: No murmur heard. ?  No friction rub. No gallop.  ?Pulmonary:  ?   Effort: No respiratory distress.  ?   Breath sounds: No stridor. No wheezing or rhonchi.  ?Chest:  ?   Chest wall: No tenderness.  ?Abdominal:  ?   General: Abdomen is flat. Bowel sounds are normal.  ?   Palpations: Abdomen is soft. There is no mass.  ?  Tenderness: There is no abdominal tenderness.  ?Musculoskeletal:  ?   Cervical back: Normal range of motion and neck supple. No rigidity or tenderness.  ?   Left lower leg: No edema.  ?Skin: ?   General: Skin is warm and dry.  ?   Coloration: Skin is not jaundiced or pale.  ?   Findings: No bruising or erythema.  ?Neurological:  ?   Mental Status: He is alert.  ?   Cranial Nerves: No cranial nerve deficit.  ?   Sensory: No sensory deficit.  ? ? ?Results for orders placed or performed during the hospital encounter of 11/13/21  ?I-STAT creatinine  ?Result Value Ref Range  ? Creatinine, Ser 1.30 (H) 0.61 - 1.24 mg/dL  ? ?   ? ? ?Current Outpatient Medications:  ?  buprenorphine-naloxone (SUBOXONE) 2-0.5 mg SUBL SL tablet, Place under the tongue., Disp: , Rfl:  ?  diclofenac (VOLTAREN) 75 MG EC tablet, Take 1 tablet (75 mg total) by mouth 2 (two) times daily., Disp: 30 tablet, Rfl: 1  ? ? ?Assessment & Plan:  ?Osteoarthritic changes RIGHT knee, greatest at medial compartment. ? ?Compression fractures of the lwoer  back Check Dexa scan ? 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 ? ?Hematuria :Stable.  ? ? ?Problem List Items Addressed This Visit   ? ?  ? Musculoskeletal and Integument  ? Closed compression fracture of lumbosacral spine ([place]) - Primary  ? Relevant Orders  ? DG Bone Density  ?  ? ?Orders Placed This Encounter  ?Procedures  ? DG Bone Density  ?  ? ?No orders of the defined types were placed in this encounter. ?  ? ?Follow up plan: ?Return in about 1 week (around 11/30/2021). ? ?

## 2021-11-28 ENCOUNTER — Ambulatory Visit: Payer: Medicare Other | Admitting: Internal Medicine

## 2021-11-28 DIAGNOSIS — S32000A Wedge compression fracture of unspecified lumbar vertebra, initial encounter for closed fracture: Secondary | ICD-10-CM | POA: Insufficient documentation

## 2021-11-30 ENCOUNTER — Ambulatory Visit (INDEPENDENT_AMBULATORY_CARE_PROVIDER_SITE_OTHER): Payer: Medicare Other | Admitting: Internal Medicine

## 2021-11-30 ENCOUNTER — Encounter: Payer: Self-pay | Admitting: Internal Medicine

## 2021-11-30 ENCOUNTER — Other Ambulatory Visit: Payer: Self-pay

## 2021-11-30 VITALS — BP 127/79 | HR 72 | Temp 98.7°F | Ht 72.01 in | Wt 253.6 lb

## 2021-11-30 DIAGNOSIS — M25561 Pain in right knee: Secondary | ICD-10-CM | POA: Diagnosis not present

## 2021-11-30 DIAGNOSIS — G8929 Other chronic pain: Secondary | ICD-10-CM | POA: Diagnosis not present

## 2021-11-30 MED ORDER — TRIAMCINOLONE ACETONIDE 40 MG/ML IJ SUSP
40.0000 mg | Freq: Once | INTRAMUSCULAR | Status: AC
  Administered 2021-11-30: 40 mg via INTRAMUSCULAR

## 2021-11-30 NOTE — Progress Notes (Signed)
? ?BP 127/79   Pulse 72   Temp 98.7 ?F (37.1 ?C) (Oral)   Ht 6' 0.01" (1.829 m)   Wt 253 lb 9.6 oz (115 kg)   SpO2 95%   BMI 34.39 kg/m?   ? ?Subjective:  ? ? Patient ID: Mark Sexton, male    DOB: 1956/07/07, 66 y.o.   MRN: [id number] ? ?Chief Complaint  ?Patient presents with  ?? Knee Pain  ?  Right knee   ? ? ?HPI: ?Mark Sexton is a 66 y.o. male ? ?Knee Pain  ?The incident occurred more than 1 week ago. The incident occurred at home. The pain is present in the right knee. The quality of the pain is described as cramping. The pain is at a severity of 5/10. The pain is moderate. Associated symptoms include a loss of motion. Pertinent negatives include no inability to bear weight, loss of sensation, muscle weakness, numbness or tingling.  ? ? ?Chief Complaint  ?Patient presents with  ?? Knee Pain  ?  Right knee   ? ? ?Relevant past medical, surgical, family and social history reviewed and updated as indicated. Interim medical history since our last visit reviewed. ?Allergies and medications reviewed and updated. ? ?Review of Systems  ?Neurological:  Negative for tingling and numbness.  ? ?Per HPI unless specifically indicated above ? ?   ?Objective:  ?  ?BP 127/79   Pulse 72   Temp 98.7 ?F (37.1 ?C) (Oral)   Ht 6' 0.01" (1.829 m)   Wt 253 lb 9.6 oz (115 kg)   SpO2 95%   BMI 34.39 kg/m?   ?Wt Readings from Last 3 Encounters:  ?11/30/21 253 lb 9.6 oz (115 kg)  ?11/23/21 254 lb 3.2 oz (115.3 kg)  ?11/20/21 250 lb (113.4 kg)  ?  ?Physical Exam ?Vitals and nursing note reviewed.  ?Constitutional:   ?   General: He is not in acute distress. ?   Appearance: Normal appearance. He is not ill-appearing or diaphoretic.  ?HENT:  ?   Head: Atraumatic.  ?Cardiovascular:  ?   Rate and Rhythm: Regular rhythm.  ?Musculoskeletal:     ?   General: Swelling and tenderness present. No deformity or signs of injury.  ?   Right lower leg: No edema.  ?   Left lower leg: No edema.  ?   Comments: Right knee with swelling noted   ?Skin: ?   General: Skin is warm and dry.  ?Neurological:  ?   Mental Status: He is alert.  ?Psychiatric:     ?   Mood and Affect: Mood normal.     ?   Behavior: Behavior normal.     ?   Thought Content: Thought content normal.  ? ? ?Results for orders placed or performed during the hospital encounter of 11/13/21  ?I-STAT creatinine  ?Result Value Ref Range  ? Creatinine, Ser 1.30 (H) 0.61 - 1.24 mg/dL  ? ?   ? ? ?Current Outpatient Medications:  ??  buprenorphine-naloxone (SUBOXONE) 2-0.5 mg SUBL SL tablet, Place under the tongue., Disp: , Rfl:  ??  diclofenac (VOLTAREN) 75 MG EC tablet, Take 1 tablet (75 mg total) by mouth 2 (two) times daily., Disp: 30 tablet, Rfl: 1  ? ? ?Assessment & Plan:  ?Knee injection ? ? The risks, benefits and side effects of treatment were discussed with the patient. After consent was obtained, using sterile technique the right knee was prepped Pt's left knee joint prepped and cleaned with  alcohol wipes.   patella was palpated and  pt injected with 1 ml of kenalog and 4 ml of lidocaine under aseptic conditions. no bleeding or post procedural pain noted. Pt felt better after procedure, tolerated procedure well.  The patient was advised to call the office if symptoms worsen or do not improve.  The patient is asked to continue to rest the knee for a few more days before resuming regular activities .Call or return to clinic prn if such symptoms occur or the knee fails to improve as anticipated. ? ? ? ?Problem List Items Addressed This Visit   ?None ?Visit Diagnoses   ? ? Chronic pain of right knee    -  Primary  ? ?  ?  ? ?No orders of the defined types were placed in this encounter. ?  ? ?No orders of the defined types were placed in this encounter. ?  ? ?Follow up plan: ?No follow-ups on file. ? ? ?

## 2021-12-06 ENCOUNTER — Ambulatory Visit (INDEPENDENT_AMBULATORY_CARE_PROVIDER_SITE_OTHER): Payer: Medicare Other | Admitting: Internal Medicine

## 2021-12-06 ENCOUNTER — Encounter: Payer: Self-pay | Admitting: Internal Medicine

## 2021-12-06 VITALS — BP 136/83 | HR 69 | Temp 99.1°F | Ht 72.01 in | Wt 249.4 lb

## 2021-12-06 DIAGNOSIS — M25561 Pain in right knee: Secondary | ICD-10-CM | POA: Diagnosis not present

## 2021-12-06 DIAGNOSIS — G8929 Other chronic pain: Secondary | ICD-10-CM | POA: Diagnosis not present

## 2021-12-06 MED ORDER — TRIAMCINOLONE ACETONIDE 40 MG/ML IJ SUSP
40.0000 mg | Freq: Once | INTRAMUSCULAR | Status: AC
  Administered 2022-02-19: 40 mg via INTRAMUSCULAR

## 2021-12-06 NOTE — Progress Notes (Signed)
? ?BP 136/83   Pulse 69   Temp 99.1 ?F (37.3 ?C) (Oral)   Ht 6' 0.01" (1.829 m)   Wt 249 lb 6.4 oz (113.1 kg)   SpO2 96%   BMI 33.82 kg/m?   ? ?Subjective:  ? ? Patient ID: Mark Sexton, male    DOB: 09-05-1956, 66 y.o.   MRN: [id number] ? ?Chief Complaint  ?Patient presents with  ? Knee Pain  ?  Chronic right knee pain  ? ? ?HPI: ?Mark Sexton is a 66 y.o. male ? ?Knee Pain  ?The incident occurred more than 1 week ago (right knee pain improving sec to shot last week). The incident occurred at home. The pain is present in the left knee. The quality of the pain is described as cramping. The pain is at a severity of 6/10. Associated symptoms include an inability to bear weight. Pertinent negatives include no loss of motion, loss of sensation, muscle weakness, numbness or tingling. The treatment provided mild relief.  ? ?Chief Complaint  ?Patient presents with  ? Knee Pain  ?  Chronic right knee pain  ? ? ?Relevant past medical, surgical, family and social history reviewed and updated as indicated. Interim medical history since our last visit reviewed. ?Allergies and medications reviewed and updated. ? ?Review of Systems  ?Neurological:  Negative for tingling and numbness.  ? ?Per HPI unless specifically indicated above ? ?   ?Objective:  ?  ?BP 136/83   Pulse 69   Temp 99.1 ?F (37.3 ?C) (Oral)   Ht 6' 0.01" (1.829 m)   Wt 249 lb 6.4 oz (113.1 kg)   SpO2 96%   BMI 33.82 kg/m?   ?Wt Readings from Last 3 Encounters:  ?12/06/21 249 lb 6.4 oz (113.1 kg)  ?11/30/21 253 lb 9.6 oz (115 kg)  ?11/23/21 254 lb 3.2 oz (115.3 kg)  ?  ?Physical Exam ?Vitals and nursing note reviewed.  ?Constitutional:   ?   General: He is not in acute distress. ?   Appearance: Normal appearance. He is not ill-appearing or diaphoretic.  ?HENT:  ?   Head: Atraumatic.  ?Cardiovascular:  ?   Rate and Rhythm: Regular rhythm.  ?Musculoskeletal:     ?   General: Tenderness present. No swelling, deformity or signs of injury.  ?   Right lower  leg: No edema.  ?   Left lower leg: No edema.  ?Skin: ?   General: Skin is warm and dry.  ?Neurological:  ?   Mental Status: He is alert.  ?Psychiatric:     ?   Mood and Affect: Mood normal.     ?   Behavior: Behavior normal.     ?   Thought Content: Thought content normal.  ? ? ?Results for orders placed or performed during the hospital encounter of 11/13/21  ?I-STAT creatinine  ?Result Value Ref Range  ? Creatinine, Ser 1.30 (H) 0.61 - 1.24 mg/dL  ? ?   ? ? ?Current Outpatient Medications:  ?  buprenorphine-naloxone (SUBOXONE) 2-0.5 mg SUBL SL tablet, Place under the tongue., Disp: , Rfl:  ?  diclofenac (VOLTAREN) 75 MG EC tablet, Take 1 tablet (75 mg total) by mouth 2 (two) times daily., Disp: 30 tablet, Rfl: 1 ? ?Current Facility-Administered Medications:  ?  triamcinolone acetonide (KENALOG-40) injection 40 mg, 40 mg, Intramuscular, Once, Rumaldo Difatta, MD  ? ? ?Assessment & Plan:  ?Knee injection ? ? The risks, benefits and side effects of treatment were discussed with  the patient. After consent was obtained, using sterile technique the left knee was prepped Pt's left knee joint prepped and cleaned with alcohol wipes.   patella was palpated and  pt injected with 1 ml of kenalog and 4 ml of lidocaine under aseptic conditions. no bleeding or post procedural pain noted. Pt felt better after procedure, tolerated procedure well.  The patient was advised to call the office if symptoms worsen or do not improve. The patient is asked to continue to rest the knee for a few more days before resuming regular activities .Call or return to clinic prn if such symptoms occur or the knee fails to improve as anticipated. ? ?Problem List Items Addressed This Visit   ? ?  ? Other  ? Chronic pain of right knee - Primary  ? Relevant Medications  ? triamcinolone acetonide (KENALOG-40) injection 40 mg  ?  ? ?No orders of the defined types were placed in this encounter. ?  ? ?Meds ordered this encounter  ?Medications  ? triamcinolone  acetonide (KENALOG-40) injection 40 mg  ?  ? ?Follow up plan: ?Return in about 3 months (around 03/08/2022). ? ? ?

## 2022-01-15 ENCOUNTER — Ambulatory Visit (INDEPENDENT_AMBULATORY_CARE_PROVIDER_SITE_OTHER): Payer: Medicare Other | Admitting: Unknown Physician Specialty

## 2022-01-15 ENCOUNTER — Encounter: Payer: Self-pay | Admitting: Internal Medicine

## 2022-01-15 VITALS — BP 137/83 | HR 67 | Temp 98.6°F | Ht 72.01 in | Wt 246.6 lb

## 2022-01-15 DIAGNOSIS — J869 Pyothorax without fistula: Secondary | ICD-10-CM

## 2022-01-15 NOTE — Progress Notes (Signed)
? ?BP 137/83   Pulse 67   Temp 98.6 ?F (37 ?C) (Oral)   Ht 6' 0.01" (1.829 m)   Wt 246 lb 9.6 oz (111.9 kg)   SpO2 98%   BMI 33.44 kg/m?   ? ?Subjective:  ? ? Patient ID: Mark Sexton, male    DOB: 08-27-1956, 66 y.o.   MRN: [id number] ? ?HPI: ?Mark Sexton is a 66 y.o. male ? ?Chief Complaint  ?Patient presents with  ? Abcess on chest  ?  Started on Saturday, patient helped it drain by pushing on it on.   ? ?Pt states there was a bump there for several years.  Starting3 days ago, it got bigger and inflamed and able to express some white drainage.   ? ?Relevant past medical, surgical, family and social history reviewed and updated as indicated. Interim medical history since our last visit reviewed. ?Allergies and medications reviewed and updated. ? ?Review of Systems ? ?Per HPI unless specifically indicated above ? ?   ?Objective:  ?  ?BP 137/83   Pulse 67   Temp 98.6 ?F (37 ?C) (Oral)   Ht 6' 0.01" (1.829 m)   Wt 246 lb 9.6 oz (111.9 kg)   SpO2 98%   BMI 33.44 kg/m?   ?Wt Readings from Last 3 Encounters:  ?01/15/22 246 lb 9.6 oz (111.9 kg)  ?12/06/21 249 lb 6.4 oz (113.1 kg)  ?11/30/21 253 lb 9.6 oz (115 kg)  ?  ?Physical Exam ?Constitutional:   ?   General: He is not in acute distress. ?   Appearance: Normal appearance. He is well-developed.  ?HENT:  ?   Head: Normocephalic and atraumatic.  ?Eyes:  ?   General: Lids are normal. No scleral icterus.    ?   Right eye: No discharge.     ?   Left eye: No discharge.  ?   Conjunctiva/sclera: Conjunctivae normal.  ?Cardiovascular:  ?   Rate and Rhythm: Normal rate.  ?Pulmonary:  ?   Effort: Pulmonary effort is normal.  ?Abdominal:  ?   Palpations: There is no hepatomegaly or splenomegaly.  ?Musculoskeletal:     ?   General: Normal range of motion.  ?Skin: ?   Coloration: Skin is not pale.  ?   Findings: No rash.  ?   Comments: Lesion center of chest about 5 cm.    ?Neurological:  ?   Mental Status: He is alert and oriented to person, place, and time.   ?Psychiatric:     ?   Behavior: Behavior normal.     ?   Thought Content: Thought content normal.     ?   Judgment: Judgment normal.  ? ? ?Results for orders placed or performed during the hospital encounter of 11/13/21  ?I-STAT creatinine  ?Result Value Ref Range  ? Creatinine, Ser 1.30 (H) 0.61 - 1.24 mg/dL  ? ?   ?Assessment & Plan:  ? ?Problem List Items Addressed This Visit   ?None ?Visit Diagnoses   ? ? Abscess of chest [facility])    -  Primary  ? Infected.  Prep for I&D  ? ?  ?  ?After using a betadine and alcohol preparation, area infiltrated with 1% Lidocaine with epinephrine.  Using a #11 blade, lesion lanced and small 1-3 mm incision made.  Expressed a large amount of purulent drainage with sac material.  Hemostat probe revealed no additional drainage or sac material.  Keep are clean and dry with antibiotic oint.  OK if it continues to drain.  No packing done today as able to express sac material ? ?If continued inflamed. Will call and consider antibiotics.    ? ? ?Follow up plan: ?Return if symptoms worsen or fail to improve. ? ? ? ? ? ?

## 2022-01-16 ENCOUNTER — Encounter: Payer: Self-pay | Admitting: Internal Medicine

## 2022-01-16 ENCOUNTER — Ambulatory Visit (INDEPENDENT_AMBULATORY_CARE_PROVIDER_SITE_OTHER): Payer: Medicare Other | Admitting: Internal Medicine

## 2022-01-16 ENCOUNTER — Ambulatory Visit: Payer: Self-pay

## 2022-01-16 VITALS — BP 137/83 | HR 68 | Temp 98.3°F | Ht 72.01 in | Wt 246.9 lb

## 2022-01-16 DIAGNOSIS — L02213 Cutaneous abscess of chest wall: Secondary | ICD-10-CM | POA: Insufficient documentation

## 2022-01-16 LAB — BAYER DCA HB A1C WAIVED: HB A1C (BAYER DCA - WAIVED): 5.4 % (ref 4.8–5.6)

## 2022-01-16 MED ORDER — SULFAMETHOXAZOLE-TRIMETHOPRIM 800-160 MG PO TABS: 1.0000 | ORAL_TABLET | Freq: Two times a day (BID) | ORAL | 0 refills | Status: AC

## 2022-01-16 NOTE — Progress Notes (Signed)
Please let pt know this was normal.

## 2022-01-16 NOTE — Telephone Encounter (Signed)
Can u call him please cannot lance here will need to go to however drained it in the first place or we can refer to gen surg thnx

## 2022-01-16 NOTE — Telephone Encounter (Signed)
?  Chief Complaint: Mark Sexton has returned ?Symptoms: red -  dried over ?Frequency: ongoing ?Pertinent Negatives: Patient denies pain, fever ?Disposition: [] ED /[] Urgent Care (no appt availability in office) / [x] Appointment(In office/virtual)/ []  [facility]/ [] Home Care/ [] Refused Recommended Disposition /[] [place] [facility]/ []  Follow-up with PCP ?Additional Notes: Pt had this boil lanced yesterday. Pt states that boil has returned, and needs to be drained again. ? ?Reason for Disposition ? [1] Boil > 1/2 inch across (> 12 mm; larger than a marble) AND [2] center is soft or pus colored ? ?Answer Assessment - Initial Assessment Questions ?1. APPEARANCE of BOIL: "What does the boil look like?"  ?    Same as yesterday ?2. LOCATION: "Where is the boil located?"  ?    chest ?3. NUMBER: "How many boils are there?"  ?    1 ?4. SIZE: "How big is the boil?" (e.g., inches, cm; compare to size of a coin or other object) ?    Same as yesterday ?5. ONSET: "When did the boil start?" ?    1 tear ago ?6. PAIN: "Is there any pain?" If Yes, ask: "How bad is the pain?"   (Scale 1-10; or mild, moderate, severe) ?    no ?7. FEVER: "Do you have a fever?" If Yes, ask: "What is it, how was it measured, and when did it start?"  ?    no ?8. SOURCE: "Have you been around anyone with boils or other Staph infections?" "Have you ever had boils before?" ?    yes ?9. OTHER SYMPTOMS: "Do you have any other symptoms?" (e.g., shaking chills, weakness, rash elsewhere on body) ?    no ?10. PREGNANCY: "Is there any chance you are pregnant?" "When was your last menstrual period?" ?      na ? ?Protocols used: Boil (Skin Abscess)-A-AH ? ?

## 2022-01-16 NOTE — Progress Notes (Signed)
? ?BP 137/83   Pulse 68   Temp 98.3 ?F (36.8 ?C) (Oral)   Ht 6' 0.01" (1.829 m)   Wt 246 lb 14.4 oz (112 kg)   SpO2 98%   BMI 33.48 kg/m?   ? ?Subjective:  ? ? Patient ID: Mark Sexton, male    DOB: 1956/07/23, 66 y.o.   MRN: [id number] ? ?Chief Complaint  ?Patient presents with  ?? Abcess on chest  ? ? ?HPI: ?Mark Sexton is a 66 y.o. male ? ?Rash ?This is a recurrent (started friday last week was I and D yesterday and recurred overnight.) problem. The current episode started in the past 7 days.  ? ? ?Chief Complaint  ?Patient presents with  ?? Abcess on chest  ? ? ?Relevant past medical, surgical, family and social history reviewed and updated as indicated. Interim medical history since our last visit reviewed. ?Allergies and medications reviewed and updated. ? ?Review of Systems  ?Skin:  Positive for rash.  ? ?Per HPI unless specifically indicated above ? ?   ?Objective:  ?  ?BP 137/83   Pulse 68   Temp 98.3 ?F (36.8 ?C) (Oral)   Ht 6' 0.01" (1.829 m)   Wt 246 lb 14.4 oz (112 kg)   SpO2 98%   BMI 33.48 kg/m?   ?Wt Readings from Last 3 Encounters:  ?01/16/22 246 lb 14.4 oz (112 kg)  ?01/15/22 246 lb 9.6 oz (111.9 kg)  ?12/06/21 249 lb 6.4 oz (113.1 kg)  ?  ?Physical Exam ?Constitutional:   ?   General: He is not in acute distress. ?   Appearance: He is not ill-appearing, toxic-appearing or diaphoretic.  ?Musculoskeletal:     ?   General: No swelling.  ?Skin: ?   Coloration: Skin is not jaundiced or pale.  ?   Findings: Erythema and lesion present. No bruising or rash.  ?   Comments: 5 x 5 " lesion noted on the chest wall in the mid sternal area  ? ?Results for orders placed or performed during the hospital encounter of 11/13/21  ?I-STAT creatinine  ?Result Value Ref Range  ? Creatinine, Ser 1.30 (H) 0.61 - 1.24 mg/dL  ? ?   ? ? ?Current Outpatient Medications:  ??  sulfamethoxazole-trimethoprim (BACTRIM DS) 800-160 MG tablet, Take 1 tablet by mouth 2 (two) times daily for 7 days., Disp: 14 tablet, Rfl:  0 ??  buprenorphine-naloxone (SUBOXONE) 2-0.5 mg SUBL SL tablet, Place under the tongue., Disp: , Rfl:  ??  diclofenac (VOLTAREN) 75 MG EC tablet, Take 1 tablet (75 mg total) by mouth 2 (two) times daily., Disp: 30 tablet, Rfl: 1 ? ?Current Facility-Administered Medications:  ??  triamcinolone acetonide (KENALOG-40) injection 40 mg, 40 mg, Intramuscular, Once, Sou Nohr, MD  ? ? ?Assessment & Plan:  ?Chest wall abscess will start pt on bactrim  ?Check a1c ?Was borderline high last time ?Might need gen surg to lance/ I and D / pack if doesn't resolve despite abx.  ?Pt verbalized understanding ?Check CBC ? ? ? ?Problem List Items Addressed This Visit   ?None ?Visit Diagnoses   ? ? Chest wall abscess    -  Primary  ? Relevant Orders  ? Ambulatory referral to General Surgery  ? ?  ?  ? ?Orders Placed This Encounter  ?Procedures  ?? Ambulatory referral to General Surgery  ?  ? ?Meds ordered this encounter  ?Medications  ?? sulfamethoxazole-trimethoprim (BACTRIM DS) 800-160 MG tablet  ?  Sig: Take  1 tablet by mouth 2 (two) times daily for 7 days.  ?  Dispense:  14 tablet  ?  Refill:  0  ?  ? ?Follow up plan: ?No follow-ups on file. ? ? ?

## 2022-01-17 LAB — CBC WITH DIFFERENTIAL/PLATELET
Basophils Absolute: 0.1 10*3/uL (ref 0.0–0.2)
Basos: 0 %
EOS (ABSOLUTE): 0.1 10*3/uL (ref 0.0–0.4)
Eos: 1 %
Hematocrit: 39.6 % (ref 37.5–51.0)
Hemoglobin: 14.4 g/dL (ref 13.0–17.7)
Immature Grans (Abs): 0.1 10*3/uL (ref 0.0–0.1)
Immature Granulocytes: 1 %
Lymphocytes Absolute: 2 10*3/uL (ref 0.7–3.1)
Lymphs: 18 %
MCH: 33.1 pg — ABNORMAL HIGH (ref 26.6–33.0)
MCHC: 36.4 g/dL — ABNORMAL HIGH (ref 31.5–35.7)
MCV: 91 fL (ref 79–97)
Monocytes Absolute: 1.1 10*3/uL — ABNORMAL HIGH (ref 0.1–0.9)
Monocytes: 9 %
Neutrophils Absolute: 8.1 10*3/uL — ABNORMAL HIGH (ref 1.4–7.0)
Neutrophils: 71 %
Platelets: 260 10*3/uL (ref 150–450)
RBC: 4.35 x10E6/uL (ref 4.14–5.80)
RDW: 12.5 % (ref 11.6–15.4)
WBC: 11.4 10*3/uL — ABNORMAL HIGH (ref 3.4–10.8)

## 2022-01-17 LAB — BASIC METABOLIC PANEL
BUN/Creatinine Ratio: 18 (ref 10–24)
BUN: 17 mg/dL (ref 8–27)
CO2: 22 mmol/L (ref 20–29)
Calcium: 9.9 mg/dL (ref 8.6–10.2)
Chloride: 101 mmol/L (ref 96–106)
Creatinine, Ser: 0.97 mg/dL (ref 0.76–1.27)
Glucose: 92 mg/dL (ref 70–99)
Potassium: 4.4 mmol/L (ref 3.5–5.2)
Sodium: 140 mmol/L (ref 134–144)
eGFR: 86 mL/min/{1.73_m2} (ref >59)

## 2022-01-24 ENCOUNTER — Other Ambulatory Visit: Payer: Self-pay

## 2022-01-24 ENCOUNTER — Ambulatory Visit (INDEPENDENT_AMBULATORY_CARE_PROVIDER_SITE_OTHER): Payer: Medicare Other | Admitting: Surgery

## 2022-01-24 ENCOUNTER — Encounter: Payer: Self-pay | Admitting: Surgery

## 2022-01-24 VITALS — BP 147/89 | HR 77 | Temp 98.2°F | Ht 72.0 in | Wt 247.0 lb

## 2022-01-24 DIAGNOSIS — L720 Epidermal cyst: Secondary | ICD-10-CM

## 2022-01-24 DIAGNOSIS — L723 Sebaceous cyst: Secondary | ICD-10-CM

## 2022-01-24 NOTE — Patient Instructions (Signed)
You may shower as usual.  Wash the area with soap and water and rinse well. Repack the wound with clean packing strip and cover with dry dressing and secure with tape. Do this daily.   Please take Tylenol and Ibuprofen for the discomfort.  Please see your follow up appointment listed below.

## 2022-01-24 NOTE — Progress Notes (Signed)
Patient ID: Mark Sexton, male   DOB: 07/07/1956, 66 y.o.   MRN: [id number]  Chief Complaint: Sternal inflamed cyst  History of Present Illness Mark Sexton is a 66 y.o. male with with a cyst its present, underwent lance like I&D, drained a bit with some manipulation but then stopped.  Currently without pain, but persisting swelling of tender mass.  Completed 7-day course of antibiotics.    Past Medical History Past Medical History:  Diagnosis Date   Allergy    Arthritis       History reviewed. No pertinent surgical history.  Allergies  Allergen Reactions   Penicillins Rash    Current Outpatient Medications  Medication Sig Dispense Refill   buprenorphine-naloxone (SUBOXONE) 2-0.5 mg SUBL SL tablet Place under the tongue.     Current Facility-Administered Medications  Medication Dose Route Frequency Provider Last Rate Last Admin   triamcinolone acetonide (KENALOG-40) injection 40 mg  40 mg Intramuscular Once Vigg, Avanti, MD        Family History Family History  Problem Relation Age of Onset   Cancer Mother    Birth defects Maternal Grandmother       Social History Social History   Tobacco Use   Smoking status: Former    Types: Cigarettes    Quit date: 11/27/2019    Years since quitting: 2.1   Smokeless tobacco: Never  Vaping Use   Vaping Use: Some days  Substance Use Topics   Alcohol use: Yes   Drug use: Yes        Review of Systems  All other systems reviewed and are negative.    Physical Exam Blood pressure (!) 147/89, pulse 77, temperature 98.2 F (36.8 C), temperature source Oral, height 6' (1.829 m), weight 247 lb (112 kg), SpO2 98 %. Last Weight  Most recent update: 01/24/2022  3:39 PM    Weight  112 kg (247 lb)             CONSTITUTIONAL: Well developed, and nourished, appropriately responsive and aware without distress.   EYES: Sclera non-icteric.   EARS, NOSE, MOUTH AND THROAT:  The oropharynx is clear. Oral mucosa is pink and moist.    Hearing is intact to voice.  NECK: Trachea is midline, and there is no jugular venous distension.  LYMPH NODES:  Lymph nodes in the neck are not enlarged. RESPIRATORY:  Lungs are clear, and breath sounds are equal bilaterally. Normal respiratory effort without pathologic use of accessory muscles. CARDIOVASCULAR: Heart is regular in rate and rhythm. GI: The abdomen is soft, nontender, and nondistended. There were no palpable masses. MUSCULOSKELETAL:  Symmetrical muscle tone appreciated in all four extremities.    SKIN: Skin turgor is normal. No pathologic skin lesions appreciated.  Midsternal dermis with elevated domelike mass of approximately 3 cm diameter, raised from the chest wall by about a centimeter and a half.  Scarred cephalad, with some density there and nearly fluctuant elsewhere.  No spontaneous or minimal drainage with slight palpation today.  Tender to touch. NEUROLOGIC:  Motor and sensation appear grossly normal.  Cranial nerves are grossly without defect. PSYCH:  Alert and oriented to person, place and time. Affect is appropriate for situation.  Data Reviewed I have personally reviewed what is currently available of the patient's imaging, recent labs and medical records.   Labs:     Latest Ref Rng & Units 01/16/2022    2:02 PM 10/03/2021   10:59 AM 02/22/2021   10:16 AM  CBC  WBC 3.4 - 10.8 x10E3/uL 11.4   6.3   7.0    Hemoglobin 13.0 - 17.7 g/dL 81.1   91.4   78.2    Hematocrit 37.5 - 51.0 % 39.6   41.1   42.1    Platelets 150 - 450 x10E3/uL 260   206         Latest Ref Rng & Units 01/16/2022    2:02 PM 11/13/2021   11:11 AM 10/03/2021   10:59 AM  CMP  Glucose 70 - 99 mg/dL 92    99    BUN 8 - 27 mg/dL 17    16    Creatinine 0.76 - 1.27 mg/dL 9.56   2.13   0.86    Sodium 134 - 144 mmol/L 140    140    Potassium 3.5 - 5.2 mmol/L 4.4    3.8    Chloride 96 - 106 mmol/L 101    103    CO2 20 - 29 mmol/L 22    22    Calcium 8.6 - 10.2 mg/dL 9.9    9.4    Total Protein 6.0 -  8.5 g/dL   6.3    Total Bilirubin 0.0 - 1.2 mg/dL   0.5    Alkaline Phos 44 - 121 IU/L   75    AST 0 - 40 IU/L   36    ALT 0 - 44 IU/L   75        Imaging:  Within last 24 hrs: No results found.  Assessment    Inflamed/infected epidermal inclusion cyst, mid sternum. Patient Active Problem List   Diagnosis Date Noted   Chest wall abscess 01/16/2022   Chronic pain of right knee 12/06/2021   Closed compression fracture of lumbosacral spine (HCC) 11/28/2021   Compression deformity of vertebra 10/12/2021   Chronic bilateral low back pain without sciatica 10/03/2021   Decreased urine output 10/03/2021   Alcohol abuse 10/03/2021   Chronic pain syndrome 04/08/2021   Pharmacologic therapy 04/08/2021   Disorder of skeletal system 04/08/2021   Problems influencing health status 04/08/2021    Plan    Excision of cyst/incision and drainage with open wound care of sternal inflamed/infected epidermal inclusion cyst. Risks of the procedure discussed in detail.  These include but are not limited to anesthesia, bleeding, infection, open wound, recurrence, lingering scar or hypertrophy of scar or keloid.  I believe he understands, questions answered, he desires to proceed without any guarantees ever expressed or implied.  Positioned supine in procedure room 9.  Prepped and draped with ChloraPrep, local infiltration with 1% lidocaine with epinephrine.  Elliptical incision was made long axis from cephalad to caudad.  A small width of skin was taken with the underlying cyst wall.  The cyst was eventually opened, and any obvious remaining cystic wall removed.  A lot of it appeared that it was overwhelmed by the inflammatory process, or with some soft tissue liquefaction.  This was irrigated and instead of closing I elected to pack the wound with half-inch iodoform packing strip.  Instructions were given to change the dressing daily or twice daily as needed based upon maintaining the moisture of the  wound.  I believe he understands and knows that he can shower to assist in removing the dressing and while the dressing is off.  We will be glad to see him back to follow-up his wound.  Face-to-face time spent with the patient and accompanying care providers(if present) was 45 minutes,  with more than 50% of the time spent counseling, educating, and coordinating care of the patient.    These notes generated with voice recognition software. I apologize for typographical errors.  Campbell Lerner M.D., FACS 01/24/2022, 4:28 PM

## 2022-01-29 ENCOUNTER — Ambulatory Visit: Payer: Medicare Other | Admitting: Surgery

## 2022-01-31 ENCOUNTER — Encounter: Payer: Medicare Other | Admitting: Surgery

## 2022-02-18 ENCOUNTER — Ambulatory Visit: Payer: Medicare Other | Admitting: Internal Medicine

## 2022-02-18 ENCOUNTER — Ambulatory Visit: Payer: Medicare Other | Admitting: Physician Assistant

## 2022-02-19 ENCOUNTER — Encounter: Payer: Self-pay | Admitting: Internal Medicine

## 2022-02-19 ENCOUNTER — Ambulatory Visit (INDEPENDENT_AMBULATORY_CARE_PROVIDER_SITE_OTHER): Payer: Medicare Other | Admitting: Internal Medicine

## 2022-02-19 ENCOUNTER — Ambulatory Visit: Payer: Medicare Other | Admitting: Internal Medicine

## 2022-02-19 VITALS — BP 123/81 | HR 76 | Temp 98.4°F | Ht 72.01 in | Wt 252.6 lb

## 2022-02-19 DIAGNOSIS — M25562 Pain in left knee: Secondary | ICD-10-CM

## 2022-02-19 DIAGNOSIS — G8929 Other chronic pain: Secondary | ICD-10-CM

## 2022-02-19 DIAGNOSIS — S61242A Puncture wound with foreign body of right middle finger without damage to nail, initial encounter: Secondary | ICD-10-CM | POA: Diagnosis not present

## 2022-02-19 DIAGNOSIS — Z880 Allergy status to penicillin: Secondary | ICD-10-CM | POA: Diagnosis not present

## 2022-02-19 DIAGNOSIS — Z1833 Retained wood fragments: Secondary | ICD-10-CM | POA: Diagnosis not present

## 2022-02-19 DIAGNOSIS — Z87891 Personal history of nicotine dependence: Secondary | ICD-10-CM | POA: Diagnosis not present

## 2022-02-19 NOTE — Progress Notes (Signed)
BP 123/81   Pulse 76   Temp 98.4 F (36.9 C) (Oral)   Ht 6' 0.01" (1.829 m)   Wt 252 lb 9.6 oz (114.6 kg)   SpO2 96%   BMI 34.25 kg/m    Subjective:    Patient ID: Mark Sexton, male    DOB: Feb 01, 1956, 65 y.o.   MRN: [id number]  Chief Complaint  Patient presents with   Knee Pain    Left knee pain, here for his knee injeciton    HPI: Mark Sexton is a 66 y.o. male  Knee Pain  The incident occurred more than 1 week ago. The pain is moderate. The pain has been Intermittent since onset. Pertinent negatives include no inability to bear weight, loss of motion, loss of sensation, muscle weakness, numbness or tingling. The treatment provided moderate relief.    Chief Complaint  Patient presents with   Knee Pain    Left knee pain, here for his knee injeciton    Relevant past medical, surgical, family and social history reviewed and updated as indicated. Interim medical history since our last visit reviewed. Allergies and medications reviewed and updated.  Review of Systems  Neurological:  Negative for tingling and numbness.    Per HPI unless specifically indicated above     Objective:    BP 123/81   Pulse 76   Temp 98.4 F (36.9 C) (Oral)   Ht 6' 0.01" (1.829 m)   Wt 252 lb 9.6 oz (114.6 kg)   SpO2 96%   BMI 34.25 kg/m   Wt Readings from Last 3 Encounters:  02/26/22 255 lb 6.4 oz (115.8 kg)  02/19/22 252 lb 9.6 oz (114.6 kg)  01/24/22 247 lb (112 kg)    Physical Exam Vitals and nursing note reviewed.  Constitutional:      Appearance: Normal appearance.  HENT:     Head: Normocephalic and atraumatic.     Right Ear: Tympanic membrane and external ear normal. There is no impacted cerumen.     Left Ear: External ear normal.     Nose: No congestion or rhinorrhea.     Mouth/Throat:     Pharynx: No oropharyngeal exudate or posterior oropharyngeal erythema.  Eyes:     Conjunctiva/sclera: Conjunctivae normal.     Pupils: Pupils are equal, round, and reactive  to light.  Pulmonary:     Effort: No respiratory distress.     Breath sounds: No stridor. No wheezing or rhonchi.  Chest:     Chest wall: No tenderness.  Abdominal:     General: Abdomen is flat. Bowel sounds are normal.     Palpations: Abdomen is soft. There is no mass.     Tenderness: There is no abdominal tenderness.  Musculoskeletal:     Cervical back: Normal range of motion. No rigidity.     Left lower leg: No edema.  Skin:    General: Skin is warm and dry.     Coloration: Skin is not jaundiced or pale.     Findings: No bruising, erythema, lesion or rash.  Neurological:     Mental Status: He is alert.     Results for orders placed or performed in visit on 01/16/22  Bayer DCA Hb A1c Waived  Result Value Ref Range   HB A1C (BAYER DCA - WAIVED) 5.4 4.8 - 5.6 %  Basic metabolic panel  Result Value Ref Range   Glucose 92 70 - 99 mg/dL   BUN 17 8 - 27 mg/dL  Creatinine, Ser 0.97 0.76 - 1.27 mg/dL   eGFR 86 >59 mL/min/1.73   BUN/Creatinine Ratio 18 10 - 24   Sodium 140 134 - 144 mmol/L   Potassium 4.4 3.5 - 5.2 mmol/L   Chloride 101 96 - 106 mmol/L   CO2 22 20 - 29 mmol/L   Calcium 9.9 8.6 - 10.2 mg/dL  CBC with Differential/Platelet  Result Value Ref Range   WBC 11.4 (H) 3.4 - 10.8 x10E3/uL   RBC 4.35 4.14 - 5.80 x10E6/uL   Hemoglobin 14.4 13.0 - 17.7 g/dL   Hematocrit 39.6 37.5 - 51.0 %   MCV 91 79 - 97 fL   MCH 33.1 (H) 26.6 - 33.0 pg   MCHC 36.4 (H) 31.5 - 35.7 g/dL   RDW 12.5 11.6 - 15.4 %   Platelets 260 150 - 450 x10E3/uL   Neutrophils 71 Not Estab. %   Lymphs 18 Not Estab. %   Monocytes 9 Not Estab. %   Eos 1 Not Estab. %   Basos 0 Not Estab. %   Neutrophils Absolute 8.1 (H) 1.4 - 7.0 x10E3/uL   Lymphocytes Absolute 2.0 0.7 - 3.1 x10E3/uL   Monocytes Absolute 1.1 (H) 0.1 - 0.9 x10E3/uL   EOS (ABSOLUTE) 0.1 0.0 - 0.4 x10E3/uL   Basophils Absolute 0.1 0.0 - 0.2 x10E3/uL   Immature Granulocytes 1 Not Estab. %   Immature Grans (Abs) 0.1 0.0 - 0.1 x10E3/uL         Current Outpatient Medications:    buprenorphine-naloxone (SUBOXONE) 2-0.5 mg SUBL SL tablet, Place under the tongue., Disp: , Rfl:    methylPREDNISolone (MEDROL DOSEPAK) 4 MG TBPK tablet, Use as directed, Disp: 1 each, Rfl: 0    Assessment & Plan:  Knee injection   The risks, benefits and side effects of treatment were discussed with the patient. After consent was obtained, using sterile technique the left knee was prepped Pt's left knee joint prepped and cleaned with alcohol wipes.   patella was palpated and  pt injected with 1 ml of kenalog and 4 ml of lidocaine under aseptic conditions. no bleeding or post procedural pain noted. Pt felt better after procedure, tolerated procedure well.  The patient was advised to call the office if symptoms worsen or do not improve. pt to fu next week for an injection of her shoulder. The patient is asked to continue to rest the knee for a few more days before resuming regular activities .Call or return to clinic prn if such symptoms occur or the knee fails to improve as anticipated.    Problem List Items Addressed This Visit       Other   Chronic pain of left knee - Primary     No orders of the defined types were placed in this encounter.    No orders of the defined types were placed in this encounter.    Follow up plan: Return in about 1 week (around 02/26/2022).

## 2022-02-21 ENCOUNTER — Telehealth: Payer: Self-pay | Admitting: *Deleted

## 2022-02-21 NOTE — Telephone Encounter (Signed)
Transition Care Management Follow-up Telephone Call Date of discharge and from where: [facility] 02-19-2022 How have you been since you were released from the hospital? Doing great Any questions or concerns? No  Items Reviewed: Did the pt receive and understand the discharge instructions provided? Yes  Medications obtained and verified? Yes  Other? No  Any new allergies since your discharge? No  Dietary orders reviewed? No Do you have support at home? Yes   Home Care and Equipment/Supplies: Were home health services ordered?  If so, what is the name of the agency?   Has the agency set up a time to come to the patient's home?  Were any new equipment or medical supplies ordered?   What is the name of the medical supply agency?  Were you able to get the supplies/equipment?  Do you have any questions related to the use of the equipment or supplies?   Functional Questionnaire: (I = Independent and D = Dependent) ADLs: I  Bathing/Dressing- I  Meal Prep- I  Eating- I  Maintaining continence- I  Transferring/Ambulation- I  Managing Meds- I  Follow up appointments reviewed:  PCP Hospital f/u appt confirmed?  No follow up needed Specialist Hospital f/u appt confirmed?   no. Are transportation arrangements needed? No  If their condition worsens, is the pt aware to call PCP or go to the Emergency Dept.? Yes Was the patient provided with contact information for the PCP's office or ED? Yes Was to pt encouraged to call back with questions or concerns? Yes

## 2022-02-26 ENCOUNTER — Encounter: Payer: Self-pay | Admitting: Internal Medicine

## 2022-02-26 ENCOUNTER — Telehealth: Payer: Self-pay | Admitting: Internal Medicine

## 2022-02-26 ENCOUNTER — Ambulatory Visit (INDEPENDENT_AMBULATORY_CARE_PROVIDER_SITE_OTHER): Payer: Medicare Other | Admitting: Internal Medicine

## 2022-02-26 VITALS — BP 130/83 | HR 62 | Temp 98.0°F | Ht 72.01 in | Wt 255.4 lb

## 2022-02-26 DIAGNOSIS — M25562 Pain in left knee: Secondary | ICD-10-CM

## 2022-02-26 DIAGNOSIS — G8929 Other chronic pain: Secondary | ICD-10-CM | POA: Diagnosis not present

## 2022-02-26 MED ORDER — METHYLPREDNISOLONE 4 MG PO TBPK: ORAL_TABLET | ORAL | 0 refills | Status: DC

## 2022-02-26 MED ORDER — TRIAMCINOLONE ACETONIDE 40 MG/ML IJ SUSP
40.0000 mg | Freq: Once | INTRAMUSCULAR | Status: AC
  Administered 2022-02-26: 40 mg via INTRAMUSCULAR

## 2022-02-26 NOTE — Telephone Encounter (Signed)
Spoke with patient and assured him that Dr. Charlotta Newton send in his Medrol dose pack to [facility] on [address]. He stated that he would call them and if they did not have he would return call to office.

## 2022-02-26 NOTE — Telephone Encounter (Signed)
Pt called about the RX for the inflammation in his knee and a steroid / pt would like these sent to [facility] on [address] / please advise when sent   Pt stated he was just seen and was advise Rx would be sent but when he went to pharmacy they didn't have any medications for him

## 2022-02-26 NOTE — Progress Notes (Signed)
BP 130/83   Pulse 62   Temp 98 F (36.7 C) (Oral)   Ht 6' 0.01" (1.829 m)   Wt 255 lb 6.4 oz (115.8 kg)   SpO2 96%   BMI 34.63 kg/m    Subjective:    Patient ID: Mark Sexton, male    DOB: Sep 13, 1955, 66 y.o.   MRN: [id number]  Chief Complaint  Patient presents with  . Knee Pain    Chronic right knee pain  Patient also states that his left knee post injection is not feeling any better pain is 8/10 today and is swollen on posterior knee. Patient would like an order for xray and ortho referral    HPI: Mark Sexton is a 66 y.o. male  Knee Pain  Incident onset: worsenin has had locking of the left knee on and off. The injury mechanism was a twisting injury. The pain is at a severity of 6/10. The pain has been Fluctuating since onset. Associated symptoms include a loss of motion. Pertinent negatives include no inability to bear weight, loss of sensation, muscle weakness, numbness or tingling. The treatment provided mild relief.    Chief Complaint  Patient presents with  . Knee Pain    Chronic right knee pain  Patient also states that his left knee post injection is not feeling any better pain is 8/10 today and is swollen on posterior knee. Patient would like an order for xray and ortho referral    Relevant past medical, surgical, family and social history reviewed and updated as indicated. Interim medical history since our last visit reviewed. Allergies and medications reviewed and updated.  Review of Systems  Neurological:  Negative for tingling and numbness.   Per HPI unless specifically indicated above     Objective:    BP 130/83   Pulse 62   Temp 98 F (36.7 C) (Oral)   Ht 6' 0.01" (1.829 m)   Wt 255 lb 6.4 oz (115.8 kg)   SpO2 96%   BMI 34.63 kg/m   Wt Readings from Last 3 Encounters:  02/26/22 255 lb 6.4 oz (115.8 kg)  02/19/22 252 lb 9.6 oz (114.6 kg)  01/24/22 247 lb (112 kg)    Physical Exam  Results for orders placed or performed in visit on  01/16/22  Bayer DCA Hb A1c Waived  Result Value Ref Range   HB A1C (BAYER DCA - WAIVED) 5.4 4.8 - 5.6 %  Basic metabolic panel  Result Value Ref Range   Glucose 92 70 - 99 mg/dL   BUN 17 8 - 27 mg/dL   Creatinine, Ser 0.97 0.76 - 1.27 mg/dL   eGFR 86 >59 mL/min/1.73   BUN/Creatinine Ratio 18 10 - 24   Sodium 140 134 - 144 mmol/L   Potassium 4.4 3.5 - 5.2 mmol/L   Chloride 101 96 - 106 mmol/L   CO2 22 20 - 29 mmol/L   Calcium 9.9 8.6 - 10.2 mg/dL  CBC with Differential/Platelet  Result Value Ref Range   WBC 11.4 (H) 3.4 - 10.8 x10E3/uL   RBC 4.35 4.14 - 5.80 x10E6/uL   Hemoglobin 14.4 13.0 - 17.7 g/dL   Hematocrit 39.6 37.5 - 51.0 %   MCV 91 79 - 97 fL   MCH 33.1 (H) 26.6 - 33.0 pg   MCHC 36.4 (H) 31.5 - 35.7 g/dL   RDW 12.5 11.6 - 15.4 %   Platelets 260 150 - 450 x10E3/uL   Neutrophils 71 Not Estab. %  Lymphs 18 Not Estab. %   Monocytes 9 Not Estab. %   Eos 1 Not Estab. %   Basos 0 Not Estab. %   Neutrophils Absolute 8.1 (H) 1.4 - 7.0 x10E3/uL   Lymphocytes Absolute 2.0 0.7 - 3.1 x10E3/uL   Monocytes Absolute 1.1 (H) 0.1 - 0.9 x10E3/uL   EOS (ABSOLUTE) 0.1 0.0 - 0.4 x10E3/uL   Basophils Absolute 0.1 0.0 - 0.2 x10E3/uL   Immature Granulocytes 1 Not Estab. %   Immature Grans (Abs) 0.1 0.0 - 0.1 x10E3/uL        Current Outpatient Medications:  .  buprenorphine-naloxone (SUBOXONE) 2-0.5 mg SUBL SL tablet, Place under the tongue., Disp: , Rfl:     Assessment & Plan:  Right knee pain :Will refer to ortho  Knee injectionThe risks, benefits and side effects of treatment were discussed with the patient. After consent was obtained, using sterile technique the left knee was prepped Pt's left knee joint prepped and cleaned with alcohol wipes.   patella was palpated and  pt injected with 1 ml of kenalog and 4 ml of lidocaine under aseptic conditions. no bleeding or post procedural pain noted. Pt felt better after procedure, tolerated procedure well.  The patient was advised to  call the office if symptoms worsen or do not improve. pt to fu next week for an injection of her shoulder. The patient is asked to continue to rest the knee for a few more days before resuming regular activities .Call or return to clinic prn if such symptoms occur or the knee fails to improve as anticipated.    Problem List Items Addressed This Visit       Other   Chronic pain of left knee - Primary   Relevant Orders   Ambulatory referral to Orthopedics     Orders Placed This Encounter  Procedures  . Ambulatory referral to Orthopedics     No orders of the defined types were placed in this encounter.    Follow up plan: No follow-ups on file.

## 2022-02-26 NOTE — Addendum Note (Signed)
Addended by: Leward Quan A on: 02/26/2022 01:50 PM   Modules accepted: Orders

## 2022-02-28 ENCOUNTER — Ambulatory Visit
Admission: RE | Admit: 2022-02-28 | Discharge: 2022-02-28 | Disposition: A | Discharge status: Home / Self Care | Payer: Medicare Other | Attending: Internal Medicine | Admitting: Internal Medicine

## 2022-02-28 ENCOUNTER — Ambulatory Visit
Admission: RE | Admit: 2022-02-28 | Discharge: 2022-02-28 | Disposition: A | Discharge status: Home / Self Care | Payer: Medicare Other | Source: Ambulatory Visit | Attending: Internal Medicine | Admitting: Internal Medicine

## 2022-02-28 DIAGNOSIS — M25562 Pain in left knee: Secondary | ICD-10-CM | POA: Diagnosis not present

## 2022-02-28 DIAGNOSIS — G8929 Other chronic pain: Secondary | ICD-10-CM | POA: Diagnosis not present

## 2022-02-28 NOTE — Progress Notes (Signed)
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  Needs to fu with ortho asap. Pl let I'm know

## 2022-03-04 ENCOUNTER — Ambulatory Visit: Payer: Medicare Other | Admitting: Internal Medicine

## 2022-04-18 ENCOUNTER — Ambulatory Visit (INDEPENDENT_AMBULATORY_CARE_PROVIDER_SITE_OTHER): Payer: Medicare Other | Admitting: *Deleted

## 2022-04-18 DIAGNOSIS — Z1211 Encounter for screening for malignant neoplasm of colon: Secondary | ICD-10-CM

## 2022-04-18 DIAGNOSIS — Z0000 Encounter for general adult medical examination without abnormal findings: Secondary | ICD-10-CM | POA: Diagnosis not present

## 2022-04-18 NOTE — Patient Instructions (Signed)
Mark Sexton , Thank you for taking time to come for your Medicare Wellness Visit. I appreciate your ongoing commitment to your health goals. Please review the following plan we discussed and let me know if I can assist you in the future.   Screening recommendations/referrals: Colonoscopy: Education provided Recommended yearly ophthalmology/optometry visit for glaucoma screening and checkup Recommended yearly dental visit for hygiene and checkup  Vaccinations: Influenza vaccine:   Education provided Pneumococcal vaccine: Education provided Tdap vaccine: up to date Shingles vaccine: Education provided    Advanced directives: Education provided  Conditions/risks identified:     Preventive Care 65 Years and Older, Male Preventive care refers to lifestyle choices and visits with your health care provider that can promote health and wellness. What does preventive care include? A yearly physical exam. This is also called an annual well check. Dental exams once or twice a year. Routine eye exams. Ask your health care provider how often you should have your eyes checked. Personal lifestyle choices, including: Daily care of your teeth and gums. Regular physical activity. Eating a healthy diet. Avoiding tobacco and drug use. Limiting alcohol use. Practicing safe sex. Taking low doses of aspirin every day. Taking vitamin and mineral supplements as recommended by your health care provider. What happens during an annual well check? The services and screenings done by your health care provider during your annual well check will depend on your age, overall health, lifestyle risk factors, and family history of disease. Counseling  Your health care provider may ask you questions about your: Alcohol use. Tobacco use. Drug use. Emotional well-being. Home and relationship well-being. Sexual activity. Eating habits. History of falls. Memory and ability to understand (cognition). Work and  work Astronomer. Screening  You may have the following tests or measurements: Height, weight, and BMI. Blood pressure. Lipid and cholesterol levels. These may be checked every 5 years, or more frequently if you are over 23 years old. Skin check. Lung cancer screening. You may have this screening every year starting at age 46 if you have a 30-pack-year history of smoking and currently smoke or have quit within the past 15 years. Fecal occult blood test (FOBT) of the stool. You may have this test every year starting at age 79. Flexible sigmoidoscopy or colonoscopy. You may have a sigmoidoscopy every 5 years or a colonoscopy every 10 years starting at age 7. Prostate cancer screening. Recommendations will vary depending on your family history and other risks. Hepatitis C blood test. Hepatitis B blood test. Sexually transmitted disease (STD) testing. Diabetes screening. This is done by checking your blood sugar (glucose) after you have not eaten for a while (fasting). You may have this done every 1-3 years. Abdominal aortic aneurysm (AAA) screening. You may need this if you are a current or former smoker. Osteoporosis. You may be screened starting at age 70 if you are at high risk. Talk with your health care provider about your test results, treatment options, and if necessary, the need for more tests. Vaccines  Your health care provider may recommend certain vaccines, such as: Influenza vaccine. This is recommended every year. Tetanus, diphtheria, and acellular pertussis (Tdap, Td) vaccine. You may need a Td booster every 10 years. Zoster vaccine. You may need this after age 51. Pneumococcal 13-valent conjugate (PCV13) vaccine. One dose is recommended after age 68. Pneumococcal polysaccharide (PPSV23) vaccine. One dose is recommended after age 52. Talk to your health care provider about which screenings and vaccines you need and how often you  need them. This information is not intended to  replace advice given to you by your health care provider. Make sure you discuss any questions you have with your health care provider. Document Released: 09/22/2015 Document Revised: 05/15/2016 Document Reviewed: 06/27/2015 Elsevier Interactive Patient Education  2017 [place] Prevention in the Home Falls can cause injuries. They can happen to people of all ages. There are many things you can do to make your home safe and to help prevent falls. What can I do on the outside of my home? Regularly fix the edges of walkways and driveways and fix any cracks. Remove anything that might make you trip as you walk through a door, such as a raised step or threshold. Trim any bushes or trees on the path to your home. Use bright outdoor lighting. Clear any walking paths of anything that might make someone trip, such as rocks or tools. Regularly check to see if handrails are loose or broken. Make sure that both sides of any steps have handrails. Any raised decks and porches should have guardrails on the edges. Have any leaves, snow, or ice cleared regularly. Use sand or salt on walking paths during winter. Clean up any spills in your garage right away. This includes oil or grease spills. What can I do in the bathroom? Use night lights. Install grab bars by the toilet and in the tub and shower. Do not use towel bars as grab bars. Use non-skid mats or decals in the tub or shower. If you need to sit down in the shower, use a plastic, non-slip stool. Keep the floor dry. Clean up any water that spills on the floor as soon as it happens. Remove soap buildup in the tub or shower regularly. Attach bath mats securely with double-sided non-slip rug tape. Do not have throw rugs and other things on the floor that can make you trip. What can I do in the bedroom? Use night lights. Make sure that you have a light by your bed that is easy to reach. Do not use any sheets or blankets that are too big for  your bed. They should not hang down onto the floor. Have a firm chair that has side arms. You can use this for support while you get dressed. Do not have throw rugs and other things on the floor that can make you trip. What can I do in the kitchen? Clean up any spills right away. Avoid walking on wet floors. Keep items that you use a lot in easy-to-reach places. If you need to reach something above you, use a strong step stool that has a grab bar. Keep electrical cords out of the way. Do not use floor polish or wax that makes floors slippery. If you must use wax, use non-skid floor wax. Do not have throw rugs and other things on the floor that can make you trip. What can I do with my stairs? Do not leave any items on the stairs. Make sure that there are handrails on both sides of the stairs and use them. Fix handrails that are broken or loose. Make sure that handrails are as long as the stairways. Check any carpeting to make sure that it is firmly attached to the stairs. Fix any carpet that is loose or worn. Avoid having throw rugs at the top or bottom of the stairs. If you do have throw rugs, attach them to the floor with carpet tape. Make sure that you have a light switch  at the top of the stairs and the bottom of the stairs. If you do not have them, ask someone to add them for you. What else can I do to help prevent falls? Wear shoes that: Do not have high heels. Have rubber bottoms. Are comfortable and fit you well. Are closed at the toe. Do not wear sandals. If you use a stepladder: Make sure that it is fully opened. Do not climb a closed stepladder. Make sure that both sides of the stepladder are locked into place. Ask someone to hold it for you, if possible. Clearly mark and make sure that you can see: Any grab bars or handrails. First and last steps. Where the edge of each step is. Use tools that help you move around (mobility aids) if they are needed. These  include: Canes. Walkers. Scooters. Crutches. Turn on the lights when you go into a dark area. Replace any light bulbs as soon as they burn out. Set up your furniture so you have a clear path. Avoid moving your furniture around. If any of your floors are uneven, fix them. If there are any pets around you, be aware of where they are. Review your medicines with your doctor. Some medicines can make you feel dizzy. This can increase your chance of falling. Ask your doctor what other things that you can do to help prevent falls. This information is not intended to replace advice given to you by your health care provider. Make sure you discuss any questions you have with your health care provider. Document Released: 06/22/2009 Document Revised: 02/01/2016 Document Reviewed: 09/30/2014 Elsevier Interactive Patient Education  2017 [employer].

## 2022-04-18 NOTE — Progress Notes (Signed)
Subjective:   Mark Sexton is a 66 y.o. male who presents for an Initial Medicare Annual Wellness Visit.  I connected with  [name] on 04/18/22 by a telephone enabled telemedicine application and verified that I am speaking with the correct person using two identifiers.   I discussed the limitations of evaluation and management by telemedicine. The patient expressed understanding and agreed to proceed.  Patient location: home  Provider location:   Tele-Health-home    Review of Systems     Cardiac Risk Factors include: advanced age (>51men, >70 women);obesity (BMI >30kg/m2);sedentary lifestyle;male gender     Objective:    Today's Vitals   04/18/22 1035  PainSc: 2    There is no height or weight on file to calculate BMI.     04/18/2022   10:34 AM 09/11/2016   10:21 AM  Advanced Directives  Does Patient Have a Medical Advance Directive? No No  Would patient like information on creating a medical advance directive? No - Patient declined     Current Medications (verified) Outpatient Encounter Medications as of 04/18/2022  Medication Sig   buprenorphine-naloxone (SUBOXONE) 2-0.5 mg SUBL SL tablet Place under the tongue.   methylPREDNISolone (MEDROL DOSEPAK) 4 MG TBPK tablet Use as directed   No facility-administered encounter medications on file as of 04/18/2022.    Allergies (verified) Penicillins   History: Past Medical History:  Diagnosis Date   Allergy    Arthritis    History reviewed. No pertinent surgical history. Family History  Problem Relation Age of Onset   Cancer Mother    Birth defects Maternal Grandmother    Social History   Socioeconomic History   Marital status: Legally Separated    Spouse name: Not on file   Number of children: Not on file   Years of education: Not on file   Highest education level: Not on file  Occupational History   Not on file  Tobacco Use   Smoking status: Former    Types: Cigarettes    Quit date: 11/27/2019     Years since quitting: 2.3   Smokeless tobacco: Never  Vaping Use   Vaping Use: Former  Substance and Sexual Activity   Alcohol use: Yes   Drug use: Yes   Sexual activity: Yes  Other Topics Concern   Not on file  Social History Narrative   Not on file   Social Determinants of Health   Financial Resource Strain: Low Risk  (04/18/2022)   Overall Financial Resource Strain (CARDIA)    Difficulty of Paying Living Expenses: Not hard at all  Food Insecurity: No Food Insecurity (04/18/2022)   Hunger Vital Sign    Worried About Running Out of Food in the Last Year: Never true    Ran Out of Food in the Last Year: Never true  Transportation Needs: No Transportation Needs (04/18/2022)   PRAPARE - Administrator, Civil Service (Medical): No    Lack of Transportation (Non-Medical): No  Physical Activity: Inactive (04/18/2022)   Exercise Vital Sign    Days of Exercise per Week: 0 days    Minutes of Exercise per Session: 0 min  Stress: No Stress Concern Present (04/18/2022)   [employer] of Occupational Health - Occupational Stress Questionnaire    Feeling of Stress : Not at all  Social Connections: Socially Isolated (04/18/2022)   Social Connection and Isolation Panel [NHANES]    Frequency of Communication with Friends and Family: Never    Frequency  of Social Gatherings with Friends and Family: Once a week    Attends Religious Services: Never    Database administrator or Organizations: No    Attends Engineer, structural: Never    Marital Status: Separated    Tobacco Counseling Counseling given: Not Answered   Clinical Intake:     Pain : 0-10 Pain Score: 2  Pain Type: Acute pain Pain Location: Rib cage Pain Descriptors / Indicators: Aching, Dull Pain Onset: In the past 7 days Pain Relieving Factors: ibuprofen  Pain Relieving Factors: ibuprofen  Nutritional Risks: None Diabetes: No  How often do you need to have someone help you when you read  instructions, pamphlets, or other written materials from your doctor or pharmacy?: 1 - Never  Diabetic?  no  Interpreter Needed?: No  Information entered by :: Remi Haggard LPN   Activities of Daily Living    04/18/2022   10:49 AM  In your present state of health, do you have any difficulty performing the following activities:  Hearing? 0  Vision? 0  Difficulty concentrating or making decisions? 0  Walking or climbing stairs? 0  Dressing or bathing? 0  Doing errands, shopping? 0  Preparing Food and eating ? N  Using the Toilet? N  In the past six months, have you accidently leaked urine? N  Do you have problems with loss of bowel control? N  Managing your Medications? N  Managing your Finances? N  Housekeeping or managing your Housekeeping? N    Patient Care Team: Loura Pardon, MD as PCP - General (Internal Medicine)  Indicate any recent Medical Services you may have received from other than [facility] providers in the past year (date may be approximate).     Assessment:   This is a routine wellness examination for Mark Sexton.  Hearing/Vision screen Hearing Screening - Comments:: No trouble hearing Vision Screening - Comments:: Not up date  Dietary issues and exercise activities discussed: Current Exercise Habits: The patient does not participate in regular exercise at present   Goals Addressed             This Visit's Progress    Patient Stated       Finish deck       Depression Screen    04/18/2022   10:42 AM 02/26/2022    8:41 AM 02/19/2022    9:54 AM 01/16/2022    1:53 PM 01/15/2022    8:50 AM 12/06/2021    8:57 AM 11/30/2021    9:29 AM  PHQ 2/9 Scores  PHQ - 2 Score 0 0 0 0 0 0 0  PHQ- 9 Score  0 3 1 3 1 2     Fall Risk    04/18/2022   10:34 AM 02/26/2022    8:41 AM 02/19/2022    9:54 AM 01/24/2022    3:34 PM 01/16/2022    1:53 PM  Fall Risk   Falls in the past year? 0 0 0 0 0  Number falls in past yr: 0 0 0  0  Injury with Fall? 0 0 0  0  Risk for fall  due to :  History of fall(s) No Fall Risks    Follow up Falls evaluation completed;Education provided;Falls prevention discussed Falls evaluation completed Falls evaluation completed  Falls evaluation completed    FALL RISK PREVENTION PERTAINING TO THE HOME:  Any stairs in or around the home? No  If so, are there any without handrails? No  Home free of loose  throw rugs in walkways, pet beds, electrical cords, etc? Yes  Adequate lighting in your home to reduce risk of falls? Yes   ASSISTIVE DEVICES UTILIZED TO PREVENT FALLS:  Life alert? No  Use of a cane, walker or w/c? No  Grab bars in the bathroom? No  Shower chair or bench in shower? No  Elevated toilet seat or a handicapped toilet? No   TIMED UP AND GO:  Was the test performed? No .    Cognitive Function:        04/18/2022   10:38 AM  6CIT Screen  What Year? 0 points  What month? 0 points  What time? 0 points  Count back from 20 0 points  Months in reverse 0 points  Repeat phrase 0 points  Total Score 0 points    Immunizations Immunization History  Administered Date(s) Administered   Moderna Sars-Covid-2 Vaccination 12/02/2019, 12/30/2019   Tdap 09/11/2016, 02/22/2021    TDAP status: Due, Education has been provided regarding the importance of this vaccine. Advised may receive this vaccine at local pharmacy or Health Dept. Aware to provide a copy of the vaccination record if obtained from local pharmacy or Health Dept. Verbalized acceptance and understanding.  Flu Vaccine status: Due, Education has been provided regarding the importance of this vaccine. Advised may receive this vaccine at local pharmacy or Health Dept. Aware to provide a copy of the vaccination record if obtained from local pharmacy or Health Dept. Verbalized acceptance and understanding.  Pneumococcal vaccine status: Due, Education has been provided regarding the importance of this vaccine. Advised may receive this vaccine at local pharmacy or  Health Dept. Aware to provide a copy of the vaccination record if obtained from local pharmacy or Health Dept. Verbalized acceptance and understanding.  Covid-19 vaccine status: Information provided on how to obtain vaccines.   Qualifies for Shingles Vaccine? Yes   Zostavax completed No   Shingrix Completed?: No.    Education has been provided regarding the importance of this vaccine. Patient has been advised to call insurance company to determine out of pocket expense if they have not yet received this vaccine. Advised may also receive vaccine at local pharmacy or Health Dept. Verbalized acceptance and understanding.  Screening Tests Health Maintenance  Topic Date Due   Hepatitis C Screening  Never done   COLONOSCOPY (Pts 45-50yrs Insurance coverage will need to be confirmed)  Never done   INFLUENZA VACCINE  04/09/2022   COVID-19 Vaccine (3 - Moderna risk series) 05/04/2022 (Originally 01/27/2020)   Zoster Vaccines- Shingrix (1 of 2) 07/19/2022 (Originally 09/14/1974)   Pneumonia Vaccine 3+ Years old (1 - PCV) 10/03/2022 (Originally 09/14/2020)   TETANUS/TDAP  02/23/2031   HPV VACCINES  Aged Out    Health Maintenance  Health Maintenance Due  Topic Date Due   Hepatitis C Screening  Never done   COLONOSCOPY (Pts 45-58yrs Insurance coverage will need to be confirmed)  Never done   INFLUENZA VACCINE  04/09/2022    Colorectal cancer screening: Referral to GI placed  . Pt aware the office will call re: appt.  Lung Cancer Screening: (Low Dose CT Chest recommended if Age 8-80 years, 30 pack-year currently smoking OR have quit w/in 15years.) does not qualify.   Lung Cancer Screening Referral:   Additional Screening:  Hepatitis C Screening: does qualify;   Vision Screening: Recommended annual ophthalmology exams for early detection of glaucoma and other disorders of the eye. Is the patient up to date with their annual eye exam?  No  Who is the provider or what is the name of the office  in which the patient attends annual eye exams?  If pt is not established with a provider, would they like to be referred to a provider to establish care? No .   Dental Screening: Recommended annual dental exams for proper oral hygiene  Community Resource Referral / Chronic Care Management: CRR required this visit?  No   CCM required this visit?  No      Plan:     I have personally reviewed and noted the following in the patient's chart:   Medical and social history Use of alcohol, tobacco or illicit drugs  Current medications and supplements including opioid prescriptions. Patient is currently taking opioid prescriptions. Information provided to patient regarding non-opioid alternatives. Patient advised to discuss non-opioid treatment plan with their provider. Functional ability and status Nutritional status Physical activity Advanced directives List of other physicians Hospitalizations, surgeries, and ER visits in previous 12 months Vitals Screenings to include cognitive, depression, and falls Referrals and appointments  In addition, I have reviewed and discussed with patient certain preventive protocols, quality metrics, and best practice recommendations. A written personalized care plan for preventive services as well as general preventive health recommendations were provided to patient.     Remi Haggard, LPN   1/61/0960   Nurse Notes:

## 2022-05-28 ENCOUNTER — Ambulatory Visit: Payer: Self-pay | Admitting: *Deleted

## 2022-05-28 NOTE — Telephone Encounter (Signed)
  Chief Complaint: abdominal bloating Symptoms: R sided swelling- hard abdomen, weight gain Frequency: few months- progressive Pertinent Negatives: Patient denies pain, GI symptoms Disposition: [] ED /[] Urgent Care (no appt availability in office) / [x] Appointment(In office/virtual)/ []  [place] [facility]/ [] Home Care/ [] Refused Recommended Disposition /[] [place] [facility]/ []  Follow-up with PCP Additional Notes: Patient states he is concerned about his liver, patient states it is time for his next knee injection- PCP has left- will discuss at office tomorrow.

## 2022-05-28 NOTE — Telephone Encounter (Signed)
Reason for Disposition  [1] MODERATE-SEVERE SWELLING of abdomen (e.g., looks very distended or swollen) AND [2] NEW-onset or much worse  Answer Assessment - Initial Assessment Questions 1. SYMPTOM: "What's the main symptom you're concerned about?" (e.g., abdomen bloating, swelling)     R side swelling- top of stomach is hard 2. ONSET: "When did swelling  start?"     Last few months- patient has put on weight 3. SEVERITY: "How bad is the bloating or swelling?"    - BLOATING: Feels gassy or bloated. No visible swelling.     - MILD SWELLING: Feels gassy or bloated. Abdomen looks mildly distended or swollen.    - MODERATE - SEVERE SWELLING: Abdomen looks very distended or swollen.      Moderate/severe 4. ABDOMEN PAIN:  "Is there any abdomen pain?" If Yes, ask: "How bad is the pain?"  (e.g., Scale 1-10; mild, moderate, or severe)   - NONE (0): No pain.   - MILD (1-3): Doesn't interfere with normal activities, abdomen soft and not tender to touch.    - MODERATE (4-7): Interferes with normal activities or awakens from sleep, abdomen tender to touch.    - SEVERE (8-10): Excruciating pain, doubled over, unable to do any normal activities.       none 5. RELIEVING AND AGGRAVATING FACTORS: "What makes it better or worse?" (e.g., certain foods, lactose, medicines)     no 6. GI HISTORY: "Do you have any history of stomach or intestine problems?" (e.g., bowel obstruction, cancer, irritable bowel)      no 7. CAUSE: "What do you think is causing the bloating?"      unsure 8. OTHER SYMPTOMS: "Do you have any other symptoms?" (e.g., belching, blood in stool, breathing difficulty, constipation, diarrhea, fever, passing gas, vomiting, weight loss, white of eyes have turned yellow)     Weight gain 9. PREGNANCY: "Is there any chance you are pregnant?" "When was your last menstrual period?"  Protocols used: Abdomen Bloating and Swelling-A-AH

## 2022-05-29 ENCOUNTER — Ambulatory Visit (INDEPENDENT_AMBULATORY_CARE_PROVIDER_SITE_OTHER): Payer: Medicare Other | Admitting: Nurse Practitioner

## 2022-05-29 ENCOUNTER — Encounter: Payer: Self-pay | Admitting: Nurse Practitioner

## 2022-05-29 VITALS — BP 148/86 | HR 72 | Temp 98.6°F | Wt 262.6 lb

## 2022-05-29 DIAGNOSIS — R1011 Right upper quadrant pain: Secondary | ICD-10-CM

## 2022-05-29 DIAGNOSIS — Z1211 Encounter for screening for malignant neoplasm of colon: Secondary | ICD-10-CM

## 2022-05-29 DIAGNOSIS — R768 Other specified abnormal immunological findings in serum: Secondary | ICD-10-CM

## 2022-05-29 DIAGNOSIS — Z23 Encounter for immunization: Secondary | ICD-10-CM | POA: Diagnosis not present

## 2022-05-29 NOTE — Progress Notes (Signed)
BP (!) 148/86   Pulse 72   Temp 98.6 F (37 C) (Oral)   Wt 262 lb 9.6 oz (119.1 kg)   SpO2 96%   BMI 35.61 kg/m    Subjective:    Patient ID: Mark Sexton, male    DOB: [birth year], 66 y.o.   MRN: [id number]  HPI: Mark Sexton is a 66 y.o. male  Chief Complaint  Patient presents with   Bloated    Patient reports abdominal bloating, pressure in the abdomen. Denies N/V/D.    ABDOMINAL PAIN  Duration:months Onset: gradual Severity:  no pain Quality: pressure-like Location:  RUQ  Episode duration:  Radiation: no Frequency: constant Alleviating factors:  Aggravating factors: Status: worse Treatments attempted: ASA and ibuprofen Fever: no Nausea: no Vomiting: no Weight loss: no Decreased appetite: no Diarrhea: no Constipation: no Blood in stool: no Heartburn: no Jaundice: no Rash: no Dysuria/urinary frequency: no Hematuria: no History of sexually transmitted disease: no Recurrent NSAID use: yes- using at least 3 a day Alcohol use at least a 12 pack a day   Relevant past medical, surgical, family and social history reviewed and updated as indicated. Interim medical history since our last visit reviewed. Allergies and medications reviewed and updated.  Review of Systems  Constitutional:  Negative for fever.  Gastrointestinal:  Positive for abdominal pain. Negative for blood in stool, constipation, diarrhea, nausea and vomiting.    Per HPI unless specifically indicated above     Objective:    BP (!) 148/86   Pulse 72   Temp 98.6 F (37 C) (Oral)   Wt 262 lb 9.6 oz (119.1 kg)   SpO2 96%   BMI 35.61 kg/m   Wt Readings from Last 3 Encounters:  05/29/22 262 lb 9.6 oz (119.1 kg)  02/26/22 255 lb 6.4 oz (115.8 kg)  02/19/22 252 lb 9.6 oz (114.6 kg)    Physical Exam Vitals and nursing note reviewed.  Constitutional:      General: He is not in acute distress.    Appearance: Normal appearance. He is not ill-appearing, toxic-appearing or diaphoretic.   HENT:     Head: Normocephalic.     Right Ear: External ear normal.     Left Ear: External ear normal.     Nose: Nose normal. No congestion or rhinorrhea.     Mouth/Throat:     Mouth: Mucous membranes are moist.  Eyes:     General:        Right eye: No discharge.        Left eye: No discharge.     Extraocular Movements: Extraocular movements intact.     Conjunctiva/sclera: Conjunctivae normal.     Pupils: Pupils are equal, round, and reactive to light.  Cardiovascular:     Rate and Rhythm: Normal rate and regular rhythm.     Heart sounds: No murmur heard. Pulmonary:     Effort: Pulmonary effort is normal. No respiratory distress.     Breath sounds: Normal breath sounds. No wheezing, rhonchi or rales.  Abdominal:     General: Abdomen is flat. Bowel sounds are normal. There is no distension.     Palpations: Abdomen is soft.     Tenderness: There is abdominal tenderness in the right upper quadrant. There is no right CVA tenderness, left CVA tenderness or guarding. Positive signs include Murphy's sign.    Musculoskeletal:     Cervical back: Normal range of motion and neck supple.  Skin:    General: Skin  is warm and dry.     Capillary Refill: Capillary refill takes less than 2 seconds.  Neurological:     General: No focal deficit present.     Mental Status: He is alert and oriented to person, place, and time.  Psychiatric:        Mood and Affect: Mood normal.        Behavior: Behavior normal.        Thought Content: Thought content normal.        Judgment: Judgment normal.     Results for orders placed or performed in visit on 01/16/22  Bayer DCA Hb A1c Waived  Result Value Ref Range   HB A1C (BAYER DCA - WAIVED) 5.4 4.8 - 5.6 %  Basic metabolic panel  Result Value Ref Range   Glucose 92 70 - 99 mg/dL   BUN 17 8 - 27 mg/dL   Creatinine, Ser 0.97 0.76 - 1.27 mg/dL   eGFR 86 >59 mL/min/1.73   BUN/Creatinine Ratio 18 10 - 24   Sodium 140 134 - 144 mmol/L   Potassium 4.4  3.5 - 5.2 mmol/L   Chloride 101 96 - 106 mmol/L   CO2 22 20 - 29 mmol/L   Calcium 9.9 8.6 - 10.2 mg/dL  CBC with Differential/Platelet  Result Value Ref Range   WBC 11.4 (H) 3.4 - 10.8 x10E3/uL   RBC 4.35 4.14 - 5.80 x10E6/uL   Hemoglobin 14.4 13.0 - 17.7 g/dL   Hematocrit 39.6 37.5 - 51.0 %   MCV 91 79 - 97 fL   MCH 33.1 (H) 26.6 - 33.0 pg   MCHC 36.4 (H) 31.5 - 35.7 g/dL   RDW 12.5 11.6 - 15.4 %   Platelets 260 150 - 450 x10E3/uL   Neutrophils 71 Not Estab. %   Lymphs 18 Not Estab. %   Monocytes 9 Not Estab. %   Eos 1 Not Estab. %   Basos 0 Not Estab. %   Neutrophils Absolute 8.1 (H) 1.4 - 7.0 x10E3/uL   Lymphocytes Absolute 2.0 0.7 - 3.1 x10E3/uL   Monocytes Absolute 1.1 (H) 0.1 - 0.9 x10E3/uL   EOS (ABSOLUTE) 0.1 0.0 - 0.4 x10E3/uL   Basophils Absolute 0.1 0.0 - 0.2 x10E3/uL   Immature Granulocytes 1 Not Estab. %   Immature Grans (Abs) 0.1 0.0 - 0.1 x10E3/uL      Assessment & Plan:   Problem List Items Addressed This Visit       Other   RUQ abdominal pain - Primary    Ongoing x a couple of months. Chronic alcohol use. Will check Hepatitis panel, CBC, CMP. Will get abdominal US to evaluate gallbladder and liver. Will make recommendations based on imaging and lab results.      Relevant Orders   Comp Met (CMET)   CBC w/Diff   Hepatitis C Antibody   US Abdomen Limited RUQ (LIVER/GB)   Acute Hep Panel & Hep B Surface Ab   Other Visit Diagnoses     Need for pneumococcal 20-valent conjugate vaccination       Relevant Orders   Pneumococcal conjugate vaccine 20-valent (Prevnar 20)   Need for influenza vaccination       Relevant Orders   Flu Vaccine QUAD 6+ mos PF IM (Fluarix Quad PF)   Screening for colon cancer       Relevant Orders   Ambulatory referral to Gastroenterology        Follow up plan: Return in about 6 weeks (around 07/10/2022) for  HTN, HLD, DM2 FU.

## 2022-05-29 NOTE — Assessment & Plan Note (Signed)
Ongoing x a couple of months. Chronic alcohol use. Will check Hepatitis panel, CBC, CMP. Will get abdominal US to evaluate gallbladder and liver. Will make recommendations based on imaging and lab results.

## 2022-05-30 LAB — COMPREHENSIVE METABOLIC PANEL
ALT: 37 IU/L (ref 0–44)
AST: 27 IU/L (ref 0–40)
Albumin/Globulin Ratio: 1.9 (ref 1.2–2.2)
Albumin: 4.4 g/dL (ref 3.9–4.9)
Alkaline Phosphatase: 84 IU/L (ref 44–121)
BUN/Creatinine Ratio: 8 — ABNORMAL LOW (ref 10–24)
BUN: 10 mg/dL (ref 8–27)
Bilirubin Total: 0.3 mg/dL (ref 0.0–1.2)
CO2: 22 mmol/L (ref 20–29)
Calcium: 9.3 mg/dL (ref 8.6–10.2)
Chloride: 102 mmol/L (ref 96–106)
Creatinine, Ser: 1.19 mg/dL (ref 0.76–1.27)
Globulin, Total: 2.3 g/dL (ref 1.5–4.5)
Glucose: 104 mg/dL — ABNORMAL HIGH (ref 70–99)
Potassium: 4.2 mmol/L (ref 3.5–5.2)
Sodium: 139 mmol/L (ref 134–144)
Total Protein: 6.7 g/dL (ref 6.0–8.5)
eGFR: 67 mL/min/{1.73_m2} (ref >59)

## 2022-05-30 LAB — CBC WITH DIFFERENTIAL/PLATELET
Basophils Absolute: 0.1 10*3/uL (ref 0.0–0.2)
Basos: 1 %
EOS (ABSOLUTE): 0.3 10*3/uL (ref 0.0–0.4)
Eos: 4 %
Hematocrit: 41.1 % (ref 37.5–51.0)
Hemoglobin: 14.2 g/dL (ref 13.0–17.7)
Immature Grans (Abs): 0.1 10*3/uL (ref 0.0–0.1)
Immature Granulocytes: 1 %
Lymphocytes Absolute: 2.1 10*3/uL (ref 0.7–3.1)
Lymphs: 31 %
MCH: 32.6 pg (ref 26.6–33.0)
MCHC: 34.5 g/dL (ref 31.5–35.7)
MCV: 95 fL (ref 79–97)
Monocytes Absolute: 0.7 10*3/uL (ref 0.1–0.9)
Monocytes: 10 %
Neutrophils Absolute: 3.5 10*3/uL (ref 1.4–7.0)
Neutrophils: 53 %
Platelets: 213 10*3/uL (ref 150–450)
RBC: 4.35 x10E6/uL (ref 4.14–5.80)
RDW: 12.8 % (ref 11.6–15.4)
WBC: 6.7 10*3/uL (ref 3.4–10.8)

## 2022-05-30 LAB — ACUTE HEP PANEL AND HEP B SURFACE AB
Hep A IgM: NEGATIVE
Hep B C IgM: NEGATIVE
Hep C Virus Ab: REACTIVE — AB
Hepatitis B Surf Ab Quant: 207.9 m[IU]/mL (ref >9.9)
Hepatitis B Surface Ag: NEGATIVE

## 2022-05-30 NOTE — Progress Notes (Signed)
Please let patient know that his lab work came back showing that he has hepatitis C antibody.  At this point it means he has had an infection at some point.  We need to do further testing to see if it is a current infection.  Also make sure to get that abdominal ultrasound that will give [place] more information. I placed the lab orders, please make patient a lab appt to get those tests done.

## 2022-05-30 NOTE — Addendum Note (Signed)
Addended by: Jon Billings on: 05/30/2022 12:37 PM   Modules accepted: Orders

## 2022-05-31 ENCOUNTER — Other Ambulatory Visit: Payer: Medicare Other

## 2022-05-31 DIAGNOSIS — R768 Other specified abnormal immunological findings in serum: Secondary | ICD-10-CM | POA: Diagnosis not present

## 2022-06-03 LAB — HEPATITIS C GENOTYPE

## 2022-06-03 LAB — HCV RNA QUANT: Hepatitis C Quantitation: NOT DETECTED IU/mL

## 2022-06-03 NOTE — Progress Notes (Signed)
Please let patient know that his further Hep C testing was negative which likely means he cleared the virus which is very common. We will await his ultrasound results to proceed further.

## 2022-06-04 ENCOUNTER — Ambulatory Visit
Admission: RE | Admit: 2022-06-04 | Discharge: 2022-06-04 | Disposition: A | Discharge status: Home / Self Care | Payer: Medicare Other | Source: Ambulatory Visit | Attending: Nurse Practitioner | Admitting: Nurse Practitioner

## 2022-06-04 DIAGNOSIS — R1011 Right upper quadrant pain: Secondary | ICD-10-CM | POA: Insufficient documentation

## 2022-06-04 DIAGNOSIS — K760 Fatty (change of) liver, not elsewhere classified: Secondary | ICD-10-CM

## 2022-06-04 NOTE — Progress Notes (Signed)
Please let patient know that his ultrasound showed that he has fatty liver.  But given his history of Hepatitis C and the pain he is experiencing I would like him to see GI.  If he is okay with this, I will place the referral.

## 2022-06-05 ENCOUNTER — Telehealth: Payer: Self-pay

## 2022-06-05 ENCOUNTER — Other Ambulatory Visit: Payer: Self-pay

## 2022-06-05 DIAGNOSIS — Z1211 Encounter for screening for malignant neoplasm of colon: Secondary | ICD-10-CM

## 2022-06-05 MED ORDER — NA SULFATE-K SULFATE-MG SULF 17.5-3.13-1.6 GM/177ML PO SOLN: 354.0000 mL | Freq: Once | ORAL | 0 refills | Status: AC

## 2022-06-05 NOTE — Telephone Encounter (Signed)
Gastroenterology Pre-Procedure Review  Request Date: 07/10/2022 Requesting Physician: Dr. Marius Ditch  PATIENT REVIEW QUESTIONS: The patient responded to the following health history questions as indicated:    1. Are you having any GI issues? no 2. Do you have a personal history of Polyps? no 3. Do you have a family history of Colon Cancer or Polyps? no 4. Diabetes Mellitus? no 5. Joint replacements in the past 12 months?no 6. Major health problems in the past 3 months?no 7. Any artificial heart valves, MVP, or defibrillator?no    MEDICATIONS & ALLERGIES:    Patient reports the following regarding taking any anticoagulation/antiplatelet therapy:   Plavix, Coumadin, Eliquis, Xarelto, Lovenox, Pradaxa, Brilinta, or Effient? no Aspirin? no  Patient confirms/reports the following medications:  Current Outpatient Medications  Medication Sig Dispense Refill   buprenorphine-naloxone (SUBOXONE) 2-0.5 mg SUBL SL tablet Place under the tongue.     No current facility-administered medications for this visit.    Patient confirms/reports the following allergies:  Allergies  Allergen Reactions   Penicillins Rash    No orders of the defined types were placed in this encounter.   AUTHORIZATION INFORMATION Primary Insurance: 1D#: Group #:  Secondary Insurance: 1D#: Group #:  SCHEDULE INFORMATION: Date:  Time: Location:

## 2022-06-06 ENCOUNTER — Telehealth: Payer: Self-pay | Admitting: Nurse Practitioner

## 2022-06-06 NOTE — Telephone Encounter (Signed)
Copied from [place]. Topic: Appointment Scheduling - Scheduling Inquiry for Clinic >> Jun 06, 2022  2:29 PM Mark Sexton wrote: Reason for CRM: patient called in to make appt for knee injections

## 2022-06-07 NOTE — Telephone Encounter (Signed)
Patient scheduled.

## 2022-06-07 NOTE — Telephone Encounter (Signed)
Okay to make an appt. Needs to be with Dr. Wynetta Emery is here.

## 2022-06-20 ENCOUNTER — Ambulatory Visit: Payer: Medicare Other | Admitting: Family Medicine

## 2022-06-25 NOTE — Progress Notes (Unsigned)
   There were no vitals taken for this visit.   Subjective:    Patient ID: Mark Sexton, male    DOB: [birth year], 66 y.o.   MRN: [id number]  HPI: Mark Sexton is a 66 y.o. male  No chief complaint on file.   Relevant past medical, surgical, family and social history reviewed and updated as indicated. Interim medical history since our last visit reviewed. Allergies and medications reviewed and updated.  Review of Systems  Per HPI unless specifically indicated above     Objective:    There were no vitals taken for this visit.  Wt Readings from Last 3 Encounters:  05/29/22 262 lb 9.6 oz (119.1 kg)  02/26/22 255 lb 6.4 oz (115.8 kg)  02/19/22 252 lb 9.6 oz (114.6 kg)    Physical Exam  Results for orders placed or performed in visit on 05/31/22  HCV RNA quant  Result Value Ref Range   Hepatitis C Quantitation HCV Not Detected IU/mL   Test Information Comment   Hepatitis C genotype  Result Value Ref Range   Hepatitis C Genotype CANCELED    Please Note (HCV): Comment       Assessment & Plan:   Problem List Items Addressed This Visit   None    Follow up plan: No follow-ups on file.

## 2022-06-26 ENCOUNTER — Encounter: Payer: Self-pay | Admitting: Nurse Practitioner

## 2022-06-26 ENCOUNTER — Ambulatory Visit (INDEPENDENT_AMBULATORY_CARE_PROVIDER_SITE_OTHER): Payer: Medicare Other | Admitting: Nurse Practitioner

## 2022-06-26 VITALS — BP 134/88 | HR 79 | Temp 98.7°F | Wt 264.5 lb

## 2022-06-26 DIAGNOSIS — G8929 Other chronic pain: Secondary | ICD-10-CM | POA: Diagnosis not present

## 2022-06-26 DIAGNOSIS — M25561 Pain in right knee: Secondary | ICD-10-CM

## 2022-06-26 NOTE — Assessment & Plan Note (Signed)
Right knee injected.  See note below.

## 2022-07-02 NOTE — Progress Notes (Unsigned)
There were no vitals taken for this visit.   Subjective:    Patient ID: Mark Sexton, male    DOB: [birth year], 66 y.o.   MRN: [id number]  HPI: DERRICK TIEGS is a 66 y.o. male presenting on 07/03/2022 for comprehensive medical examination. Current medical complaints include:{Blank single:19197::"none","***"}  He currently lives with: Interim Problems from his last visit: {Blank single:19197::"yes","no"}  Patient presents to clinic with complaints of Left knee pain.  Patient would like steroid injection in knee.    Depression Screen done today and results listed below:     05/29/2022    8:18 AM 04/18/2022   10:42 AM 02/26/2022    8:41 AM 02/19/2022    9:54 AM 01/16/2022    1:53 PM  Depression screen PHQ 2/9  Decreased Interest 1 0 0 0 0  Down, Depressed, Hopeless 1 0 0 0 0  PHQ - 2 Score 2 0 0 0 0  Altered sleeping 2  0 3 1  Tired, decreased energy 1  0 0 0  Change in appetite 0  0 0 0  Feeling bad or failure about yourself  0  0 0 0  Trouble concentrating 0  0 0 0  Moving slowly or fidgety/restless 0  0 0 0  Suicidal thoughts 0  0 0 0  PHQ-9 Score 5  0 3 1  Difficult doing work/chores Not difficult at all  Not difficult at all Somewhat difficult Not difficult at all    The patient {has/does not have:19849} a history of falls. I {did/did not:19850} complete a risk assessment for falls. A plan of care for falls {was/was not:19852} documented.   Past Medical History:  Past Medical History:  Diagnosis Date   Allergy    Arthritis     Surgical History:  No past surgical history on file.  Medications:  Current Outpatient Medications on File Prior to Visit  Medication Sig   buprenorphine-naloxone (SUBOXONE) 2-0.5 mg SUBL SL tablet Place under the tongue.   No current facility-administered medications on file prior to visit.    Allergies:  Allergies  Allergen Reactions   Penicillins Rash    Social History:  Social History   Socioeconomic History   Marital  status: Legally Separated    Spouse name: Not on file   Number of children: Not on file   Years of education: Not on file   Highest education level: Not on file  Occupational History   Not on file  Tobacco Use   Smoking status: Former    Types: Cigarettes    Quit date: 11/27/2019    Years since quitting: 2.5   Smokeless tobacco: Never  Vaping Use   Vaping Use: Former  Substance and Sexual Activity   Alcohol use: Yes   Drug use: Yes   Sexual activity: Yes  Other Topics Concern   Not on file  Social History Narrative   Not on file   Social Determinants of Health   Financial Resource Strain: Low Risk  (04/18/2022)   Overall Financial Resource Strain (CARDIA)    Difficulty of Paying Living Expenses: Not hard at all  Food Insecurity: No Food Insecurity (04/18/2022)   Hunger Vital Sign    Worried About Running Out of Food in the Last Year: Never true    [place] in the Last Year: Never true  Transportation Needs: No Transportation Needs (04/18/2022)   PRAPARE - Hydrologist (Medical): No  Lack of Transportation (Non-Medical): No  Physical Activity: Inactive (04/18/2022)   Exercise Vital Sign    Days of Exercise per Week: 0 days    Minutes of Exercise per Session: 0 min  Stress: No Stress Concern Present (04/18/2022)   [place]    Feeling of Stress : Not at all  Social Connections: Socially Isolated (04/18/2022)   Social Connection and Isolation Panel [NHANES]    Frequency of Communication with Friends and Family: Never    Frequency of Social Gatherings with Friends and Family: Once a week    Attends Religious Services: Never    Marine scientist or Organizations: No    Attends Archivist Meetings: Never    Marital Status: Separated  Intimate Partner Violence: Not At Risk (04/18/2022)   Humiliation, Afraid, Rape, and Kick questionnaire    Fear of Current or  Ex-Partner: No    Emotionally Abused: No    Physically Abused: No    Sexually Abused: No   Social History   Tobacco Use  Smoking Status Former   Types: Cigarettes   Quit date: 11/27/2019   Years since quitting: 2.5  Smokeless Tobacco Never   Social History   Substance and Sexual Activity  Alcohol Use Yes    Family History:  Family History  Problem Relation Age of Onset   Cancer Mother    Birth defects Maternal Grandmother     Past medical history, surgical history, medications, allergies, family history and social history reviewed with patient today and changes made to appropriate areas of the chart.   ROS All other ROS negative except what is listed above and in the HPI.      Objective:    There were no vitals taken for this visit.  Wt Readings from Last 3 Encounters:  06/26/22 264 lb 8 oz (120 kg)  05/29/22 262 lb 9.6 oz (119.1 kg)  02/26/22 255 lb 6.4 oz (115.8 kg)    Physical Exam  Results for orders placed or performed in visit on 05/31/22  HCV RNA quant  Result Value Ref Range   Hepatitis C Quantitation HCV Not Detected IU/mL   Test Information Comment   Hepatitis C genotype  Result Value Ref Range   Hepatitis C Genotype CANCELED    Please Note (HCV): Comment       Assessment & Plan:   Problem List Items Addressed This Visit   None    Discussed aspirin prophylaxis for myocardial infarction prevention and decision was {Blank single:19197::"it was not indicated","made to continue ASA","made to start ASA","made to stop ASA","that we recommended ASA, and patient refused"}  LABORATORY TESTING:  Health maintenance labs ordered today as discussed above.   The natural history of prostate cancer and ongoing controversy regarding screening and potential treatment outcomes of prostate cancer has been discussed with the patient. The meaning of a false positive PSA and a false negative PSA has been discussed. He indicates understanding of the limitations of  this screening test and wishes *** to proceed with screening PSA testing.   IMMUNIZATIONS:   - Tdap: Tetanus vaccination status reviewed: {tetanus status:315746}. - Influenza: {Blank single:19197::"Up to date","Administered today","Postponed to flu season","Refused","Given elsewhere"} - Pneumovax: {Blank single:19197::"Up to date","Administered today","Not applicable","Refused","Given elsewhere"} - Prevnar: {Blank single:19197::"Up to date","Administered today","Not applicable","Refused","Given elsewhere"} - COVID: {Blank single:19197::"Up to date","Administered today","Not applicable","Refused","Given elsewhere"} - HPV: {Blank single:19197::"Up to date","Administered today","Not applicable","Refused","Given elsewhere"} - Shingrix vaccine: {Blank single:19197::"Up to date","Administered today","Not applicable","Refused","Given elsewhere"}  SCREENING: - Colonoscopy: {Blank single:19197::"Up to date","Ordered today","Not applicable","Refused","Done elsewhere"}  Discussed with patient purpose of the colonoscopy is to detect colon cancer at curable precancerous or early stages   - AAA Screening: {Blank single:19197::"Up to date","Ordered today","Not applicable","Refused","Done elsewhere"}  -Hearing Test: {Blank single:19197::"Up to date","Ordered today","Not applicable","Refused","Done elsewhere"}  -Spirometry: {Blank single:19197::"Up to date","Ordered today","Not applicable","Refused","Done elsewhere"}   PATIENT COUNSELING:    Sexuality: Discussed sexually transmitted diseases, partner selection, use of condoms, avoidance of unintended pregnancy  and contraceptive alternatives.   Advised to avoid cigarette smoking.  I discussed with the patient that most people either abstain from alcohol or drink within safe limits (<=14/week and <=4 drinks/occasion for males, <=7/weeks and <= 3 drinks/occasion for females) and that the risk for alcohol disorders and other health effects rises proportionally  with the number of drinks per week and how often a drinker exceeds daily limits.  Discussed cessation/primary prevention of drug use and availability of treatment for abuse.   Diet: Encouraged to adjust caloric intake to maintain  or achieve ideal body weight, to reduce intake of dietary saturated fat and total fat, to limit sodium intake by avoiding high sodium foods and not adding table salt, and to maintain adequate dietary potassium and calcium preferably from fresh fruits, vegetables, and low-fat dairy products.    stressed the importance of regular exercise  Injury prevention: Discussed safety belts, safety helmets, smoke detector, smoking near bedding or upholstery.   Dental health: Discussed importance of regular tooth brushing, flossing, and dental visits.   Follow up plan: NEXT PREVENTATIVE PHYSICAL DUE IN 1 YEAR. No follow-ups on file.

## 2022-07-03 ENCOUNTER — Encounter: Payer: Self-pay | Admitting: Nurse Practitioner

## 2022-07-03 ENCOUNTER — Ambulatory Visit (INDEPENDENT_AMBULATORY_CARE_PROVIDER_SITE_OTHER): Payer: Medicare Other | Admitting: Nurse Practitioner

## 2022-07-03 VITALS — BP 138/84 | HR 67 | Temp 98.2°F | Wt 263.6 lb

## 2022-07-03 DIAGNOSIS — G8929 Other chronic pain: Secondary | ICD-10-CM

## 2022-07-03 DIAGNOSIS — Z0000 Encounter for general adult medical examination without abnormal findings: Secondary | ICD-10-CM | POA: Diagnosis not present

## 2022-07-03 DIAGNOSIS — Z136 Encounter for screening for cardiovascular disorders: Secondary | ICD-10-CM

## 2022-07-03 DIAGNOSIS — R972 Elevated prostate specific antigen [PSA]: Secondary | ICD-10-CM

## 2022-07-03 DIAGNOSIS — M25562 Pain in left knee: Secondary | ICD-10-CM | POA: Diagnosis not present

## 2022-07-03 DIAGNOSIS — Z87891 Personal history of nicotine dependence: Secondary | ICD-10-CM

## 2022-07-03 LAB — URINALYSIS, ROUTINE W REFLEX MICROSCOPIC
Bilirubin, UA: NEGATIVE
Glucose, UA: NEGATIVE
Leukocytes,UA: NEGATIVE
Nitrite, UA: NEGATIVE
Protein,UA: NEGATIVE
RBC, UA: NEGATIVE
Specific Gravity, UA: >1.03 — ABNORMAL HIGH (ref 1.005–1.030)
Urobilinogen, Ur: 0.2 mg/dL (ref 0.2–1.0)
pH, UA: 5.5 (ref 5.0–7.5)

## 2022-07-03 MED ORDER — METHYLPREDNISOLONE 4 MG PO TBPK: ORAL_TABLET | ORAL | 0 refills | Status: DC

## 2022-07-03 NOTE — Assessment & Plan Note (Signed)
Left knee injected.  See note below.

## 2022-07-04 LAB — CBC WITH DIFFERENTIAL/PLATELET
Basophils Absolute: 0.1 10*3/uL (ref 0.0–0.2)
Basos: 1 %
EOS (ABSOLUTE): 0.2 10*3/uL (ref 0.0–0.4)
Eos: 2 %
Hematocrit: 42.7 % (ref 37.5–51.0)
Hemoglobin: 14.7 g/dL (ref 13.0–17.7)
Immature Grans (Abs): 0.1 10*3/uL (ref 0.0–0.1)
Immature Granulocytes: 2 %
Lymphocytes Absolute: 2.4 10*3/uL (ref 0.7–3.1)
Lymphs: 28 %
MCH: 32.7 pg (ref 26.6–33.0)
MCHC: 34.4 g/dL (ref 31.5–35.7)
MCV: 95 fL (ref 79–97)
Monocytes Absolute: 1 10*3/uL — ABNORMAL HIGH (ref 0.1–0.9)
Monocytes: 12 %
Neutrophils Absolute: 4.9 10*3/uL (ref 1.4–7.0)
Neutrophils: 55 %
Platelets: 232 10*3/uL (ref 150–450)
RBC: 4.49 x10E6/uL (ref 4.14–5.80)
RDW: 12 % (ref 11.6–15.4)
WBC: 8.7 10*3/uL (ref 3.4–10.8)

## 2022-07-04 LAB — PSA: Prostate Specific Ag, Serum: 4.1 ng/mL — ABNORMAL HIGH (ref 0.0–4.0)

## 2022-07-04 LAB — LIPID PANEL
Chol/HDL Ratio: 3.3 ratio (ref 0.0–5.0)
Cholesterol, Total: 194 mg/dL (ref 100–199)
HDL: 58 mg/dL (ref >39)
LDL Chol Calc (NIH): 99 mg/dL (ref 0–99)
Triglycerides: 217 mg/dL — ABNORMAL HIGH (ref 0–149)
VLDL Cholesterol Cal: 37 mg/dL (ref 5–40)

## 2022-07-04 LAB — COMPREHENSIVE METABOLIC PANEL
ALT: 53 IU/L — ABNORMAL HIGH (ref 0–44)
AST: 41 IU/L — ABNORMAL HIGH (ref 0–40)
Albumin/Globulin Ratio: 2.1 (ref 1.2–2.2)
Albumin: 4.8 g/dL (ref 3.9–4.9)
Alkaline Phosphatase: 93 IU/L (ref 44–121)
BUN/Creatinine Ratio: 18 (ref 10–24)
BUN: 18 mg/dL (ref 8–27)
Bilirubin Total: 0.4 mg/dL (ref 0.0–1.2)
CO2: 20 mmol/L (ref 20–29)
Calcium: 9.8 mg/dL (ref 8.6–10.2)
Chloride: 100 mmol/L (ref 96–106)
Creatinine, Ser: 1.01 mg/dL (ref 0.76–1.27)
Globulin, Total: 2.3 g/dL (ref 1.5–4.5)
Glucose: 97 mg/dL (ref 70–99)
Potassium: 4.4 mmol/L (ref 3.5–5.2)
Sodium: 136 mmol/L (ref 134–144)
Total Protein: 7.1 g/dL (ref 6.0–8.5)
eGFR: 82 mL/min/{1.73_m2} (ref >59)

## 2022-07-04 LAB — TSH: TSH: 3.29 u[IU]/mL (ref 0.450–4.500)

## 2022-07-05 NOTE — Progress Notes (Signed)
Hi Mark Sexton. It was nice to see you this week.  Your lab work shows that your PSA (prostate cancer screening) bumped up from the last time it was drawn.  I'd like you to come back in 1 month and repeat this as a nurse visit.  Your liver enzymes are slightly elevated. I recommend decreasing your alcohol consumption.  Otherwise, lab work looks good.  No other concerns at this time.  Follow up as discussed.

## 2022-07-05 NOTE — Addendum Note (Signed)
Addended by: Jon Billings on: 07/05/2022 09:42 AM   Modules accepted: Orders

## 2022-07-08 ENCOUNTER — Telehealth: Payer: Self-pay

## 2022-07-08 NOTE — Telephone Encounter (Signed)
Patient is calling because he states he wants to reschedule colonoscopy but does not know when he wants to reschedule  he states he will call back in a month to do so. Called Trish and canceled the procedure

## 2022-07-10 ENCOUNTER — Ambulatory Visit: Admit: 2022-07-10 | Payer: Medicare Other | Admitting: Gastroenterology

## 2022-07-10 SURGERY — COLONOSCOPY WITH PROPOFOL
Anesthesia: General

## 2022-08-06 ENCOUNTER — Other Ambulatory Visit: Payer: Medicare Other

## 2022-08-29 ENCOUNTER — Other Ambulatory Visit: Payer: Self-pay

## 2022-08-29 DIAGNOSIS — Z122 Encounter for screening for malignant neoplasm of respiratory organs: Secondary | ICD-10-CM

## 2022-08-29 DIAGNOSIS — Z87891 Personal history of nicotine dependence: Secondary | ICD-10-CM

## 2022-10-15 ENCOUNTER — Ambulatory Visit: Payer: 59 | Admitting: Gastroenterology

## 2022-10-16 ENCOUNTER — Ambulatory Visit (INDEPENDENT_AMBULATORY_CARE_PROVIDER_SITE_OTHER): Payer: 59 | Admitting: Acute Care

## 2022-10-16 ENCOUNTER — Encounter: Payer: Self-pay | Admitting: Acute Care

## 2022-10-16 DIAGNOSIS — Z87891 Personal history of nicotine dependence: Secondary | ICD-10-CM | POA: Diagnosis not present

## 2022-10-16 NOTE — Progress Notes (Signed)
Virtual Visit via Telephone Note  I connected with Mark Sexton on 10/16/22 at  8:30 AM EST by telephone and verified that I am speaking with the correct person using two identifiers.  Location: Patient: At home Provider: [address], [place], [place], [address]    I discussed the limitations, risks, security and privacy concerns of performing an evaluation and management service by telephone and the availability of in person appointments. I also discussed with the patient that there may be a patient responsible charge related to this service. The patient expressed understanding and agreed to proceed.    Shared Decision Making Visit Lung Cancer Screening Program [phone])   Eligibility: Age 67 y.o. Pack Years Smoking History Calculation 24 pack year smoking history (# packs/per year x # years smoked) Recent History of coughing up blood  no Unexplained weight loss? no ( >Than 15 pounds within the last 6 months ) Prior History Lung / other cancer no (Diagnosis within the last 5 years already requiring surveillance chest CT Scans). Smoking Status Former Smoker Former Smokers: Years since quit: 2 years  Quit Date: 3.20/2021  Visit Components: Discussion included one or more decision making aids. yes Discussion included risk/benefits of screening. yes Discussion included potential follow up diagnostic testing for abnormal scans. yes Discussion included meaning and risk of over diagnosis. yes Discussion included meaning and risk of False Positives. yes Discussion included meaning of total radiation exposure. yes  Counseling Included: Importance of adherence to annual lung cancer LDCT screening. yes Impact of comorbidities on ability to participate in the program. yes Ability and willingness to under diagnostic treatment. yes  Smoking Cessation Counseling: Current Smokers:  Discussed importance of smoking cessation. yes Information about tobacco cessation classes and  interventions provided to patient. yes Patient provided with "ticket" for LDCT Scan. yes Symptomatic Patient. no  Counseling NA Diagnosis Code: Tobacco Use Z72.0 Asymptomatic Patient yes  Counseling (Intermediate counseling: > three minutes counseling) R5188 Former Smokers:  Discussed the importance of maintaining cigarette abstinence. yes Diagnosis Code: Personal History of Nicotine Dependence. C16.606 Information about tobacco cessation classes and interventions provided to patient. Yes Patient provided with "ticket" for LDCT Scan. yes Written Order for Lung Cancer Screening with LDCT placed in Epic. Yes (CT Chest Lung Cancer Screening Low Dose W/O CM) [id number] Z12.2-Screening of respiratory organs Z87.891-Personal history of nicotine dependence  I spent 25 minutes of face to face time/virtual visit time  with  Mark Sexton discussing the risks and benefits of lung cancer screening. We took the time to pause the power point at intervals to allow for questions to be asked and answered to ensure understanding. We discussed that he had taken the single most powerful action possible to decrease his risk of developing lung cancer when he quit smoking. I counseled him to remain smoke free, and to contact me if he ever had the desire to smoke again so that I can provide resources and tools to help support the effort to remain smoke free. We discussed the time and location of the scan, and that either  Doroteo Glassman RN, Joella Prince, RN or I  or I will call / send a letter with the results within  24-72 hours of receiving them. He has the office contact information in the event he needs to speak with me,  he verbalized understanding of all of the above and had no further questions upon leaving the office.     I explained to the patient that there has  been a high incidence of coronary artery disease noted on these exams. I explained that this is a non-gated exam therefore degree or severity cannot be  determined. This patient is not on statin therapy. I have asked the patient to follow-up with their PCP regarding any incidental finding of coronary artery disease and management with diet or medication as they feel is clinically indicated. The patient verbalized understanding of the above and had no further questions.     Magdalen Spatz, NP 10/16/2022

## 2022-10-16 NOTE — Patient Instructions (Signed)
Thank you for participating in the [facility]. It was our pleasure to meet you today. We will call you with the results of your scan within the next few days. Your scan will be assigned a Lung RADS category score by the physicians reading the scans.  This Lung RADS score determines follow up scanning.  See below for description of categories, and follow up screening recommendations. We will be in touch to schedule your follow up screening annually or based on recommendations of our providers. We will fax a copy of your scan results to your Primary Care Physician, or the physician who referred you to the program, to ensure they have the results. Please call the office if you have any questions or concerns regarding your scanning experience or results.  Our office number is [phone]. Please speak with Denise Phelps, RN. , or  Denise Buckner RN, They are  our Lung Cancer Screening RN.'s If They are unavailable when you call, Please leave a message on the voice mail. We will return your call at our earliest convenience.This voice mail is monitored several times a day.  Remember, if your scan is normal, we will scan you annually as long as you continue to meet the criteria for the program. (Age 50-80, Current smoker or smoker who has quit within the last 15 years). If you are a smoker, remember, quitting is the single most powerful action that you can take to decrease your risk of lung cancer and other pulmonary, breathing related problems. We know quitting is hard, and we are here to help.  Please let us know if there is anything we can do to help you meet your goal of quitting. If you are a former smoker, congratulations. We are proud of you! Remain smoke free! Remember you can refer friends or family members through the number above.  We will screen them to make sure they meet criteria for the program. Thank you for helping us take better care of you by  participating in Lung Screening.  You can receive free nicotine replacement therapy ( patches, gum or mints) by calling 1-800-QUIT NOW. Please call so we can get you on the path to becoming  a non-smoker. I know it is hard, but you can do this!  Lung RADS Categories:  Lung RADS 1: no nodules or definitely non-concerning nodules.  Recommendation is for a repeat annual scan in 12 months.  Lung RADS 2:  nodules that are non-concerning in appearance and behavior with a very low likelihood of becoming an active cancer. Recommendation is for a repeat annual scan in 12 months.  Lung RADS 3: nodules that are probably non-concerning , includes nodules with a low likelihood of becoming an active cancer.  Recommendation is for a 6-month repeat screening scan. Often noted after an upper respiratory illness. We will be in touch to make sure you have no questions, and to schedule your 6-month scan.  Lung RADS 4 A: nodules with concerning findings, recommendation is most often for a follow up scan in 3 months or additional testing based on our provider's assessment of the scan. We will be in touch to make sure you have no questions and to schedule the recommended 3 month follow up scan.  Lung RADS 4 B:  indicates findings that are concerning. We will be in touch with you to schedule additional diagnostic testing based on our provider's  assessment of the scan.  Other options for assistance in smoking cessation (   As covered by your insurance benefits)  Hypnosis for smoking cessation  [employer] [phone]  Acupuncture for smoking cessation  [facility] [phone]   

## 2022-10-17 ENCOUNTER — Ambulatory Visit: Admission: RE | Admit: 2022-10-17 | Payer: 59 | Source: Ambulatory Visit

## 2022-10-22 ENCOUNTER — Ambulatory Visit: Admission: RE | Admit: 2022-10-22 | Payer: 59 | Source: Ambulatory Visit

## 2022-10-30 ENCOUNTER — Ambulatory Visit
Admission: RE | Admit: 2022-10-30 | Discharge: 2022-10-30 | Disposition: A | Discharge status: Home / Self Care | Payer: 59 | Source: Ambulatory Visit | Attending: Acute Care | Admitting: Acute Care

## 2022-10-30 DIAGNOSIS — Z87891 Personal history of nicotine dependence: Secondary | ICD-10-CM | POA: Diagnosis not present

## 2022-10-30 DIAGNOSIS — Z122 Encounter for screening for malignant neoplasm of respiratory organs: Secondary | ICD-10-CM | POA: Insufficient documentation

## 2022-10-31 ENCOUNTER — Other Ambulatory Visit: Payer: Self-pay

## 2022-10-31 DIAGNOSIS — Z87891 Personal history of nicotine dependence: Secondary | ICD-10-CM

## 2022-10-31 DIAGNOSIS — Z122 Encounter for screening for malignant neoplasm of respiratory organs: Secondary | ICD-10-CM

## 2022-10-31 NOTE — Progress Notes (Signed)
Hi Guage.  Overall your CT of your lungs looks good.  I do recommend taking Calcium with vitamin D over the counter due to osteopenia (decreased bone density) observed in your spine.    It also shows that you have some plaque in your aorta and emphysema.  We can discuss this further in April at your next visit.

## 2022-11-04 ENCOUNTER — Encounter: Payer: Self-pay | Admitting: Nurse Practitioner

## 2022-11-04 ENCOUNTER — Ambulatory Visit (INDEPENDENT_AMBULATORY_CARE_PROVIDER_SITE_OTHER): Payer: 59 | Admitting: Nurse Practitioner

## 2022-11-04 VITALS — BP 125/55 | HR 73 | Temp 99.3°F | Wt 275.9 lb

## 2022-11-04 DIAGNOSIS — M25561 Pain in right knee: Secondary | ICD-10-CM | POA: Diagnosis not present

## 2022-11-04 DIAGNOSIS — G8929 Other chronic pain: Secondary | ICD-10-CM

## 2022-11-04 NOTE — Progress Notes (Signed)
BP (!) 125/55   Pulse 73   Temp 99.3 F (37.4 C) (Oral)   Wt 275 lb 14.4 oz (125.1 kg)   SpO2 98%   BMI 37.41 kg/m    Subjective:    Patient ID: Mark Sexton, male    DOB: 01/31/1956, 67 y.o.   MRN: [id number]  HPI: [name] is a 67 y.o. male  Chief Complaint  Patient presents with   Knee Pain    Pt states he has been having bilateral knee pain for the last few years. States the R hurts worse than the L. Has increased pain with weight baring like walking. States he has been doing cortisone shots but they are no longer helping.    KNEE PAIN Pt states he has been having bilateral knee pain for the last few years. States the R hurts worse than the L. Has increased pain with weight baring like walking. States he has been doing cortisone shots but they are no longer helping.  Patient states stepping up on the curb is extremely painful.       Relevant past medical, surgical, family and social history reviewed and updated as indicated. Interim medical history since our last visit reviewed. Allergies and medications reviewed and updated.  Review of Systems  Musculoskeletal:        Bilateral knee pain    Per HPI unless specifically indicated above     Objective:    BP (!) 125/55   Pulse 73   Temp 99.3 F (37.4 C) (Oral)   Wt 275 lb 14.4 oz (125.1 kg)   SpO2 98%   BMI 37.41 kg/m   Wt Readings from Last 3 Encounters:  11/04/22 275 lb 14.4 oz (125.1 kg)  07/03/22 263 lb 9.6 oz (119.6 kg)  06/26/22 264 lb 8 oz (120 kg)    Physical Exam Vitals and nursing note reviewed.  Constitutional:      General: He is not in acute distress.    Appearance: Normal appearance. He is not ill-appearing, toxic-appearing or diaphoretic.  HENT:     Head: Normocephalic.     Right Ear: External ear normal.     Left Ear: External ear normal.     Nose: Nose normal. No congestion or rhinorrhea.     Mouth/Throat:     Mouth: Mucous membranes are moist.  Eyes:     General:         Right eye: No discharge.        Left eye: No discharge.     Extraocular Movements: Extraocular movements intact.     Conjunctiva/sclera: Conjunctivae normal.     Pupils: Pupils are equal, round, and reactive to light.  Cardiovascular:     Rate and Rhythm: Normal rate and regular rhythm.     Heart sounds: No murmur heard. Pulmonary:     Effort: Pulmonary effort is normal. No respiratory distress.     Breath sounds: Normal breath sounds. No wheezing, rhonchi or rales.  Abdominal:     General: Abdomen is flat. Bowel sounds are normal.  Musculoskeletal:     Cervical back: Normal range of motion and neck supple.  Skin:    General: Skin is warm and dry.     Capillary Refill: Capillary refill takes less than 2 seconds.  Neurological:     General: No focal deficit present.     Mental Status: He is alert and oriented to person, place, and time.  Psychiatric:  Mood and Affect: Mood normal.        Behavior: Behavior normal.        Thought Content: Thought content normal.        Judgment: Judgment normal.     Results for orders placed or performed in visit on 07/03/22  TSH  Result Value Ref Range   TSH 3.290 0.450 - 4.500 uIU/mL  PSA  Result Value Ref Range   Prostate Specific Ag, Serum 4.1 (H) 0.0 - 4.0 ng/mL  Lipid panel  Result Value Ref Range   Cholesterol, Total 194 100 - 199 mg/dL   Triglycerides 217 (H) 0 - 149 mg/dL   HDL 58 >39 mg/dL   VLDL Cholesterol Cal 37 5 - 40 mg/dL   LDL Chol Calc (NIH) 99 0 - 99 mg/dL   Chol/HDL Ratio 3.3 0.0 - 5.0 ratio  CBC with Differential/Platelet  Result Value Ref Range   WBC 8.7 3.4 - 10.8 x10E3/uL   RBC 4.49 4.14 - 5.80 x10E6/uL   Hemoglobin 14.7 13.0 - 17.7 g/dL   Hematocrit 42.7 37.5 - 51.0 %   MCV 95 79 - 97 fL   MCH 32.7 26.6 - 33.0 pg   MCHC 34.4 31.5 - 35.7 g/dL   RDW 12.0 11.6 - 15.4 %   Platelets 232 150 - 450 x10E3/uL   Neutrophils 55 Not Estab. %   Lymphs 28 Not Estab. %   Monocytes 12 Not Estab. %   Eos 2 Not  Estab. %   Basos 1 Not Estab. %   Neutrophils Absolute 4.9 1.4 - 7.0 x10E3/uL   Lymphocytes Absolute 2.4 0.7 - 3.1 x10E3/uL   Monocytes Absolute 1.0 (H) 0.1 - 0.9 x10E3/uL   EOS (ABSOLUTE) 0.2 0.0 - 0.4 x10E3/uL   Basophils Absolute 0.1 0.0 - 0.2 x10E3/uL   Immature Granulocytes 2 Not Estab. %   Immature Grans (Abs) 0.1 0.0 - 0.1 x10E3/uL  Comprehensive metabolic panel  Result Value Ref Range   Glucose 97 70 - 99 mg/dL   BUN 18 8 - 27 mg/dL   Creatinine, Ser 1.01 0.76 - 1.27 mg/dL   eGFR 82 >59 mL/min/1.73   BUN/Creatinine Ratio 18 10 - 24   Sodium 136 134 - 144 mmol/L   Potassium 4.4 3.5 - 5.2 mmol/L   Chloride 100 96 - 106 mmol/L   CO2 20 20 - 29 mmol/L   Calcium 9.8 8.6 - 10.2 mg/dL   Total Protein 7.1 6.0 - 8.5 g/dL   Albumin 4.8 3.9 - 4.9 g/dL   Globulin, Total 2.3 1.5 - 4.5 g/dL   Albumin/Globulin Ratio 2.1 1.2 - 2.2   Bilirubin Total 0.4 0.0 - 1.2 mg/dL   Alkaline Phosphatase 93 44 - 121 IU/L   AST 41 (H) 0 - 40 IU/L   ALT 53 (H) 0 - 44 IU/L  Urinalysis, Routine w reflex microscopic  Result Value Ref Range   Specific Gravity, UA >1.030 (H) 1.005 - 1.030   pH, UA 5.5 5.0 - 7.5   Color, UA Yellow Yellow   Appearance Ur Clear Clear   Leukocytes,UA Negative Negative   Protein,UA Negative Negative/Trace   Glucose, UA Negative Negative   Ketones, UA Trace (A) Negative   RBC, UA Negative Negative   Bilirubin, UA Negative Negative   Urobilinogen, Ur 0.2 0.2 - 1.0 mg/dL   Nitrite, UA Negative Negative   Microscopic Examination Comment       Assessment & Plan:   Problem List Items Addressed This Visit  Other   Chronic pain of right knee - Primary    Chronic pain of both knees.  Right worse than the left.  Cortisone injections are no longer helpful.  Referral placed for Ortho to discuss knee replacement.       Relevant Orders   Ambulatory referral to Orthopedic Surgery     Follow up plan: Return if symptoms worsen or fail to improve.

## 2022-11-04 NOTE — Assessment & Plan Note (Signed)
Chronic pain of both knees.  Right worse than the left.  Cortisone injections are no longer helpful.  Referral placed for Ortho to discuss knee replacement.

## 2022-11-11 DIAGNOSIS — M1711 Unilateral primary osteoarthritis, right knee: Secondary | ICD-10-CM | POA: Diagnosis not present

## 2022-11-12 DIAGNOSIS — M1711 Unilateral primary osteoarthritis, right knee: Secondary | ICD-10-CM | POA: Diagnosis not present

## 2022-12-12 DIAGNOSIS — Z01818 Encounter for other preprocedural examination: Secondary | ICD-10-CM | POA: Diagnosis not present

## 2022-12-13 ENCOUNTER — Telehealth: Payer: Self-pay | Admitting: Nurse Practitioner

## 2022-12-13 DIAGNOSIS — Z01810 Encounter for preprocedural cardiovascular examination: Secondary | ICD-10-CM

## 2022-12-13 NOTE — Telephone Encounter (Unsigned)
Copied from CRM [id number]. Topic: General - Inquiry >> Dec 13, 2022 10:36 AM De Blanch wrote: Reason for CRM: Pt stated he is scheduled for knee surgery on April 18; however, he needs a referral to cardiology needs clearance from cardiology because they found some blockages that they would like for him to discuss with the cardiologist before surgery, as well as pain management. Pt stated he is scheduled to see Ms.Holdsworth on April 11, but he wants to ask if she can send the referrals so he doesn't have to reschedule his surgery.  Please advise.

## 2022-12-13 NOTE — Telephone Encounter (Signed)
Urgent referral placed today. I am not sure he will be able to get in with cardiology prior to 4/18

## 2022-12-18 ENCOUNTER — Ambulatory Visit: Payer: 59 | Attending: Cardiology

## 2022-12-18 ENCOUNTER — Ambulatory Visit: Payer: 59 | Attending: Cardiology | Admitting: Cardiology

## 2022-12-18 ENCOUNTER — Encounter: Payer: Self-pay | Admitting: Cardiology

## 2022-12-18 VITALS — BP 142/98 | HR 78 | Ht 72.0 in | Wt 276.0 lb

## 2022-12-18 DIAGNOSIS — Z01810 Encounter for preprocedural cardiovascular examination: Secondary | ICD-10-CM | POA: Diagnosis not present

## 2022-12-18 DIAGNOSIS — R030 Elevated blood-pressure reading, without diagnosis of hypertension: Secondary | ICD-10-CM

## 2022-12-18 DIAGNOSIS — I2584 Coronary atherosclerosis due to calcified coronary lesion: Secondary | ICD-10-CM | POA: Diagnosis not present

## 2022-12-18 DIAGNOSIS — I2510 Atherosclerotic heart disease of native coronary artery without angina pectoris: Secondary | ICD-10-CM | POA: Diagnosis not present

## 2022-12-18 DIAGNOSIS — R0609 Other forms of dyspnea: Secondary | ICD-10-CM

## 2022-12-18 LAB — ECHOCARDIOGRAM COMPLETE
AR max vel: 3.99 cm2
AV Area VTI: 3.75 cm2
AV Area mean vel: 3.59 cm2
AV Mean grad: 3 mmHg
AV Peak grad: 5.1 mmHg
Ao pk vel: 1.13 m/s
Area-P 1/2: 3.77 cm2
Calc EF: 50 %
Height: 72 in
S' Lateral: 4 cm
Single Plane A2C EF: 50.3 %
Single Plane A4C EF: 48.8 %
Weight: 4416 oz

## 2022-12-18 NOTE — Patient Instructions (Signed)
Medication Instructions:   Your physician recommends that you continue on your current medications as directed. Please refer to the Current Medication list given to you today.  *If you need a refill on your cardiac medications before your next appointment, please call your pharmacy*   Lab Work:  None Ordered  If you have labs (blood work) drawn today and your tests are completely normal, you will receive your results only by: MyChart Message (if you have MyChart) OR A paper copy in the mail If you have any lab test that is abnormal or we need to change your treatment, we will call you to review the results.   Testing/Procedures:  Your physician has requested that you have an echocardiogram. Echocardiography is a painless test that uses sound waves to create images of your heart. It provides your doctor with information about the size and shape of your heart and how well your heart's chambers and valves are working. This procedure takes approximately one hour. There are no restrictions for this procedure. Please do NOT wear cologne, perfume, aftershave, or lotions (deodorant is allowed). Please arrive 15 minutes prior to your appointment time.  2. [facility] MYOVIEW  Your caregiver has ordered a Stress Test with nuclear imaging. The purpose of this test is to evaluate the blood supply to your heart muscle. This procedure is referred to as a "Non-Invasive Stress Test." This is because other than having an IV started in your vein, nothing is inserted or "invades" your body. Cardiac stress tests are done to find areas of poor blood flow to the heart by determining the extent of coronary artery disease (CAD). Some patients exercise on a treadmill, which naturally increases the blood flow to your heart, while others who are  unable to walk on a treadmill due to physical limitations have a pharmacologic/chemical stress agent called Lexiscan . This medicine will mimic walking on a treadmill by temporarily  increasing your coronary blood flow.   Please note: these test may take anywhere between 2-4 hours to complete  PLEASE REPORT TO [facility] MEDICAL MALL ENTRANCE  THE VOLUNTEERS AT THE FIRST DESK WILL DIRECT YOU WHERE TO GO  Date of Procedure:_____________________________________  Arrival Time for Procedure:______________________________   PLEASE NOTIFY THE OFFICE AT LEAST 24 HOURS IN ADVANCE IF YOU ARE UNABLE TO KEEP YOUR APPOINTMENT.  [phone] AND  PLEASE NOTIFY NUCLEAR MEDICINE AT [facility] AT LEAST 24 HOURS IN ADVANCE IF YOU ARE UNABLE TO KEEP YOUR APPOINTMENT. [phone]  How to prepare for your Myoview test:  Do not eat or drink after midnight No caffeine for 24 hours prior to test No smoking 24 hours prior to test. Your medication may be taken with water.  If your doctor stopped a medication because of this test, do not take that medication. Ladies, please do not wear dresses.  Skirts or pants are appropriate. Please wear a short sleeve shirt. No perfume, cologne or lotion. Wear comfortable walking shoes. No heels!   Follow-Up: At [facility], you and your health needs are our priority.  As part of our continuing mission to provide you with exceptional heart care, we have created designated Provider Care Teams.  These Care Teams include your primary Cardiologist (physician) and Advanced Practice Providers (APPs -  Physician Assistants and Nurse Practitioners) who all work together to provide you with the care you need, when you need it.  We recommend signing up for the patient portal called "MyChart".  Sign up information is provided on this After Visit  Summary.  MyChart is used to connect with patients for Virtual Visits (Telemedicine).  Patients are able to view lab/test results, encounter notes, upcoming appointments, etc.  Non-urgent messages can be sent to your provider as well.   To learn more about what you can do with MyChart, go to [email or web address].     Your next appointment:    After testing Provider:   You may see Debbe Odea, MD or one of the following Advanced Practice Providers on your designated Care Team:   Nicolasa Ducking, NP Eula Listen, PA-C Cadence Fransico Michael, PA-C Charlsie Quest, NP

## 2022-12-18 NOTE — Progress Notes (Signed)
Cardiology Office Note:    Date:  12/18/2022   ID:  Mark Sexton, DOB 10-21-1955, MRN [id number]  PCP:  Larae Grooms, NP   [facility] Providers Cardiologist:  Debbe Odea, MD     Referring MD: Dorcas Carrow, DO   Chief Complaint  Patient presents with   New Patient (Initial Visit)    Here for cardiac clearance for knee surgery scheduled on 12/26/22.  Patient concerned of exertional SOBr.    History of Present Illness:    Mark Sexton is a 67 y.o. male with a hx of osteoarthritis, former smoker x 30+ years, presenting for preop evaluation and shortness of breath.  He has a history of bilateral knee pain, followed up with orthopedic surgery, knee replacement is being planned.  Patient endorses shortness of breath with exertion.  Denies chest pain.  Blood pressure adequately controlled at home.  Chest CT lung cancer screening 10/2022 showed RCA calcification.  Emphysema also noted  Past Medical History:  Diagnosis Date   Allergy    Arthritis     History reviewed. No pertinent surgical history.  Current Medications: Current Meds  Medication Sig   buprenorphine-naloxone (SUBOXONE) 2-0.5 mg SUBL SL tablet Place under the tongue.     Allergies:   Penicillins   Social History   Socioeconomic History   Marital status: Legally Separated    Spouse name: Not on file   Number of children: Not on file   Years of education: Not on file   Highest education level: Not on file  Occupational History   Not on file  Tobacco Use   Smoking status: Former    Packs/day: 0.60    Years: 38.00    Additional pack years: 0.00    Total pack years: 22.80    Types: Cigarettes    Quit date: 11/27/2019    Years since quitting: 3.0   Smokeless tobacco: Never  Vaping Use   Vaping Use: Former  Substance and Sexual Activity   Alcohol use: Yes    Comment: on occasions   Drug use: Not Currently   Sexual activity: Yes  Other Topics Concern   Not on file  Social  History Narrative   Not on file   Social Determinants of Health   Financial Resource Strain: Low Risk  (04/18/2022)   Overall Financial Resource Strain (CARDIA)    Difficulty of Paying Living Expenses: Not hard at all  Food Insecurity: No Food Insecurity (04/18/2022)   Hunger Vital Sign    Worried About Running Out of Food in the Last Year: Never true    Ran Out of Food in the Last Year: Never true  Transportation Needs: No Transportation Needs (04/18/2022)   PRAPARE - Administrator, Civil Service (Medical): No    Lack of Transportation (Non-Medical): No  Physical Activity: Inactive (04/18/2022)   Exercise Vital Sign    Days of Exercise per Week: 0 days    Minutes of Exercise per Session: 0 min  Stress: No Stress Concern Present (04/18/2022)   [employer] of Occupational Health - Occupational Stress Questionnaire    Feeling of Stress : Not at all  Social Connections: Socially Isolated (04/18/2022)   Social Connection and Isolation Panel [NHANES]    Frequency of Communication with Friends and Family: Never    Frequency of Social Gatherings with Friends and Family: Once a week    Attends Religious Services: Never    Database administrator or Organizations: No  Attends Banker Meetings: Never    Marital Status: Separated     Family History: The patient's family history includes Birth defects in his maternal grandmother; Cancer in his mother.  ROS:   Please see the history of present illness.     All other systems reviewed and are negative.  EKGs/Labs/Other Studies Reviewed:    The following studies were reviewed today:   EKG:  EKG is  ordered today.  The ekg ordered today demonstrates EKG shows normal sinus rhythm  Recent Labs: 07/03/2022: ALT 53; BUN 18; Creatinine, Ser 1.01; Hemoglobin 14.7; Platelets 232; Potassium 4.4; Sodium 136; TSH 3.290  Recent Lipid Panel    Component Value Date/Time   CHOL 194 07/03/2022 0825   TRIG 217 (H)  07/03/2022 0825   HDL 58 07/03/2022 0825   CHOLHDL 3.3 07/03/2022 0825   LDLCALC 99 07/03/2022 0825     Risk Assessment/Calculations:     HYPERTENSION CONTROL Vitals:   12/18/22 0850 12/18/22 0851  BP: (!) 140/88 (!) 142/98    The patient's blood pressure is elevated above target today.  In order to address the patient's elevated BP: Blood pressure will be monitored at home to determine if medication changes need to be made.          Physical Exam:    VS:  BP (!) 142/98 (BP Location: Right Arm, Patient Position: Sitting)   Pulse 78   Ht 6' (1.829 m)   Wt 276 lb (125.2 kg)   SpO2 96%   BMI 37.43 kg/m     Wt Readings from Last 3 Encounters:  12/18/22 276 lb (125.2 kg)  11/04/22 275 lb 14.4 oz (125.1 kg)  07/03/22 263 lb 9.6 oz (119.6 kg)     GEN:  Well nourished, well developed in no acute distress HEENT: Normal NECK: No JVD; No carotid bruits CARDIAC: RRR, no murmurs, rubs, gallops RESPIRATORY:  Clear to auscultation without rales, wheezing or rhonchi  ABDOMEN: Soft, non-tender, non-distended MUSCULOSKELETAL:  No edema; No deformity  SKIN: Warm and dry NEUROLOGIC:  Alert and oriented x 3 PSYCHIATRIC:  Normal affect   ASSESSMENT:    1. Dyspnea on exertion   2. Pre-operative cardiovascular examination   3. Coronary artery calcification   4. Elevated BP without diagnosis of hypertension    PLAN:    In order of problems listed above:  Dyspnea on exertion, coronary calcifications.  Get Lexiscan Myoview to evaluate ischemia.  Get echocardiogram.  Patient has emphysema, also obese which could contribute to his symptoms. Preop evaluation, knee surgery being planned.  Lexiscan Myoview as above.  If Myoview shows no significant ischemia, okay to proceed with procedure from a cardiac perspective. Coronary calcifications, cholesterol controlled.  Myoview as above, currently on higher dose of ibuprofen due to knee pain.  Aspirin after procedure. BP elevated, usually  controlled.  Continue to monitor.     Follow-up after cardiac testing   Medication Adjustments/Labs and Tests Ordered: Current medicines are reviewed at length with the patient today.  Concerns regarding medicines are outlined above.  Orders Placed This Encounter  Procedures   NM Myocar Multi W/Spect W/Wall Motion / EF   EKG 12-Lead   ECHOCARDIOGRAM COMPLETE   No orders of the defined types were placed in this encounter.   Patient Instructions  Medication Instructions:   Your physician recommends that you continue on your current medications as directed. Please refer to the Current Medication list given to you today.  *If you need a refill  on your cardiac medications before your next appointment, please call your pharmacy*   Lab Work:  None Ordered  If you have labs (blood work) drawn today and your tests are completely normal, you will receive your results only by: MyChart Message (if you have MyChart) OR A paper copy in the mail If you have any lab test that is abnormal or we need to change your treatment, we will call you to review the results.   Testing/Procedures:  Your physician has requested that you have an echocardiogram. Echocardiography is a painless test that uses sound waves to create images of your heart. It provides your doctor with information about the size and shape of your heart and how well your heart's chambers and valves are working. This procedure takes approximately one hour. There are no restrictions for this procedure. Please do NOT wear cologne, perfume, aftershave, or lotions (deodorant is allowed). Please arrive 15 minutes prior to your appointment time.  2. [facility] MY[facility]IEW  Your caregiver has ordered a Stress Test with nuclear imaging. The purpose of this test is to evaluate the blood supply to your heart muscle. This procedure is referred to as a "Non-Invasive Stress Test." This is because other than having an IV started in your vein, nothing is  inserted or "invades" your body. Cardiac stress tests are done to find areas of poor blood flow to the heart by determining the extent of coronary artery disease (CAD). Some patients exercise on a treadmill, which naturally increases the blood flow to your heart, while others who are  unable to walk on a treadmill due to physical limitations have a pharmacologic/chemical stress agent called Lexiscan . This medicine will mimic walking on a treadmill by temporarily increasing your coronary blood flow.   Please note: these test may take anywhere between 2-4 hours to complete  PLEASE REPORT TO [facility]  THE VOLUNTEERS AT THE FIRST DESK WILL DIRECT YOU WHERE TO GO  Date of Procedure:_____________________________________  Arrival Time for Procedure:______________________________   PLEASE NOTIFY THE OFFICE AT LEAST 24 HOURS IN ADVANCE IF YOU ARE UNABLE TO KEEP YOUR APPOINTMENT.  [phone] AND  PLEASE NOTIFY [facility] AT [facility] AT LEAST 24 HOURS IN ADVANCE IF YOU ARE UNABLE TO KEEP YOUR APPOINTMENT. [phone]  How to prepare for your Myoview test:  Do not eat or drink after midnight No caffeine for 24 hours prior to test No smoking 24 hours prior to test. Your medication may be taken with water.  If your doctor stopped a medication because of this test, do not take that medication. Ladies, please do not wear dresses.  Skirts or pants are appropriate. Please wear a short sleeve shirt. No perfume, cologne or lotion. Wear comfortable walking shoes. No heels!   Follow-Up: At [facility], you and your health needs are our priority.  As part of our continuing mission to provide you with exceptional heart care, we have created designated Provider Care Teams.  These Care Teams include your primary Cardiologist (physician) and Advanced Practice Providers (APPs -  Physician Assistants and Nurse Practitioners) who all work together to provide you with the care you  need, when you need it.  We recommend signing up for the patient portal called "MyChart".  Sign up information is provided on this After Visit Summary.  MyChart is used to connect with patients for Virtual Visits (Telemedicine).  Patients are able to view lab/test results, encounter notes, upcoming appointments, etc.  Non-urgent messages can be sent  to your provider as well.   To learn more about what you can do with MyChart, go to [email or web address].    Your next appointment:    After testing Provider:   You may see Debbe Odea, MD or one of the following Advanced Practice Providers on your designated Care Team:   Nicolasa Ducking, NP Eula Listen, PA-C Cadence Fransico Michael, PA-C Charlsie Quest, NP   Signed, Debbe Odea, MD  12/18/2022 9:31 AM    [facility]

## 2022-12-19 ENCOUNTER — Ambulatory Visit (INDEPENDENT_AMBULATORY_CARE_PROVIDER_SITE_OTHER): Payer: 59 | Admitting: Nurse Practitioner

## 2022-12-19 ENCOUNTER — Encounter: Payer: Self-pay | Admitting: Nurse Practitioner

## 2022-12-19 VITALS — BP 142/89 | HR 73 | Temp 97.6°F | Ht 72.01 in | Wt 281.9 lb

## 2022-12-19 DIAGNOSIS — G894 Chronic pain syndrome: Secondary | ICD-10-CM

## 2022-12-19 NOTE — Progress Notes (Signed)
BP (!) 142/89   Pulse 73   Temp 97.6 F (36.4 C) (Oral)   Ht 6' 0.01" (1.829 m)   Wt 281 lb 14.4 oz (127.9 kg)   SpO2 97%   BMI 38.22 kg/m    Subjective:    Patient ID: Mark Sexton, male    DOB: 1956/04/18, 67 y.o.   MRN: [id number]  HPI: Mark Sexton is a 67 y.o. male  Chief Complaint  Patient presents with   Pain Management    Needs a referral for his suboxone   Patient states he needs a referral to a suboxone clinic due to needing a prescription for his suboxone.  He will be having surgery on his knee and anastasia wants him to have a prescription for the medication.       Relevant past medical, surgical, family and social history reviewed and updated as indicated. Interim medical history since our last visit reviewed. Allergies and medications reviewed and updated.  Review of Systems  Musculoskeletal:        Bilateral knee pain    Per HPI unless specifically indicated above     Objective:    BP (!) 142/89   Pulse 73   Temp 97.6 F (36.4 C) (Oral)   Ht 6' 0.01" (1.829 m)   Wt 281 lb 14.4 oz (127.9 kg)   SpO2 97%   BMI 38.22 kg/m   Wt Readings from Last 3 Encounters:  12/19/22 281 lb 14.4 oz (127.9 kg)  12/18/22 276 lb (125.2 kg)  11/04/22 275 lb 14.4 oz (125.1 kg)    Physical Exam Vitals and nursing note reviewed.  Constitutional:      General: He is not in acute distress.    Appearance: Normal appearance. He is obese. He is not ill-appearing, toxic-appearing or diaphoretic.  HENT:     Head: Normocephalic.     Right Ear: External ear normal.     Left Ear: External ear normal.     Nose: Nose normal. No congestion or rhinorrhea.     Mouth/Throat:     Mouth: Mucous membranes are moist.  Eyes:     General:        Right eye: No discharge.        Left eye: No discharge.     Extraocular Movements: Extraocular movements intact.     Conjunctiva/sclera: Conjunctivae normal.     Pupils: Pupils are equal, round, and reactive to light.   Cardiovascular:     Rate and Rhythm: Normal rate and regular rhythm.     Heart sounds: No murmur heard. Pulmonary:     Effort: Pulmonary effort is normal. No respiratory distress.     Breath sounds: Normal breath sounds. No wheezing, rhonchi or rales.  Abdominal:     General: Abdomen is flat. Bowel sounds are normal.  Musculoskeletal:     Cervical back: Normal range of motion and neck supple.  Skin:    General: Skin is warm and dry.     Capillary Refill: Capillary refill takes less than 2 seconds.  Neurological:     General: No focal deficit present.     Mental Status: He is alert and oriented to person, place, and time.  Psychiatric:        Mood and Affect: Mood normal.        Behavior: Behavior normal.        Thought Content: Thought content normal.        Judgment: Judgment normal.  Results for orders placed or performed in visit on 12/18/22  ECHOCARDIOGRAM COMPLETE  Result Value Ref Range   Weight 4,416 oz   Height 72 in   BP 142/98 mmHg   AR max vel 3.99 cm2   AV Peak grad 5.1 mmHg   Ao pk vel 1.13 m/s   S' Lateral 4.00 cm   Area-P 1/2 3.77 cm2   AV Area VTI 3.75 cm2   AV Mean grad 3.0 mmHg   Single Plane A4C EF 48.8 %   Single Plane A2C EF 50.3 %   Calc EF 50.0 %   AV Area mean vel 3.59 cm2   Est EF 55 - 60%       Assessment & Plan:   Problem List Items Addressed This Visit       Other   Chronic pain syndrome - Primary (Chronic)    Referral placed for pain clinic.      Relevant Orders   Ambulatory referral to Pain Clinic     Follow up plan: No follow-ups on file.

## 2022-12-19 NOTE — Assessment & Plan Note (Signed)
Referral placed for pain clinic.

## 2022-12-20 ENCOUNTER — Encounter
Admission: RE | Admit: 2022-12-20 | Discharge: 2022-12-20 | Disposition: A | Discharge status: Home / Self Care | Payer: 59 | Source: Ambulatory Visit | Attending: Cardiology | Admitting: Cardiology

## 2022-12-20 DIAGNOSIS — R0609 Other forms of dyspnea: Secondary | ICD-10-CM | POA: Diagnosis not present

## 2022-12-20 DIAGNOSIS — Z01810 Encounter for preprocedural cardiovascular examination: Secondary | ICD-10-CM | POA: Insufficient documentation

## 2022-12-20 LAB — NM MYOCAR MULTI W/SPECT W/WALL MOTION / EF
Estimated workload: 1
Exercise duration (min): 0 min
Exercise duration (sec): 0 s
LV dias vol: 132 mL (ref 62–150)
LV sys vol: 44 mL
MPHR: 153 {beats}/min
Nuc Stress EF: 67 %
Peak HR: 98 {beats}/min
Percent HR: 64 %
Rest HR: 72 {beats}/min
Rest Nuclear Isotope Dose: 10.4 mCi
SDS: 1
SRS: 12
SSS: 4
ST Depression (mm): 0 mm
Stress Nuclear Isotope Dose: 32.1 mCi
TID: 1

## 2022-12-20 MED ORDER — TECHNETIUM TC 99M TETROFOSMIN IV KIT
32.1200 | PACK | Freq: Once | INTRAVENOUS | Status: AC | PRN
  Administered 2022-12-20: 32.12 via INTRAVENOUS

## 2022-12-20 MED ORDER — REGADENOSON 0.4 MG/5ML IV SOLN
0.4000 mg | Freq: Once | INTRAVENOUS | Status: AC
  Administered 2022-12-20: 0.4 mg via INTRAVENOUS

## 2022-12-20 MED ORDER — TECHNETIUM TC 99M TETROFOSMIN IV KIT
10.3500 | PACK | Freq: Once | INTRAVENOUS | Status: AC | PRN
  Administered 2022-12-20: 10.35 via INTRAVENOUS

## 2022-12-20 NOTE — Telephone Encounter (Signed)
Copied from CRM [id number]. Topic: Referral - Question >> Dec 20, 2022 12:08 PM Santiya F wrote: Reason for CRM: Pt was referred to [facility] for pain management and when pt called, they said they didn't receive the referral and to fax it to [phone]. [facility] says they have been having trouble receiving referrals.

## 2023-01-02 ENCOUNTER — Ambulatory Visit: Payer: 59 | Admitting: Nurse Practitioner

## 2023-01-14 ENCOUNTER — Ambulatory Visit: Payer: 59 | Admitting: Cardiology

## 2023-01-21 ENCOUNTER — Telehealth: Payer: Self-pay | Admitting: Nurse Practitioner

## 2023-01-21 NOTE — Telephone Encounter (Signed)
Please let patient know we are working on finding somewhere for patient to go for the Suboxone.  Please make sure he still needs this referral.

## 2023-01-21 NOTE — Telephone Encounter (Signed)
Copied from CRM [id number]. Topic: Referral - Status >> Jan 21, 2023 10:09 AM Epimenio Foot F wrote: Reason for CRM: Olegario Messier with Pain [facility] is calling in because they received a referral for this pt. Olegario Messier says they don't prescribe Suboxone and will have to decline the referral because the doctors there don't think they can do anything for the pt. Olegario Messier says if there are any questions she can be reached at [phone].

## 2023-01-21 NOTE — Telephone Encounter (Signed)
Called and spoke to the patient. Advised him of Karen's message. Patient states that he has an appointment with Dr. Odis Luster on Friday about his surgery and he thinks that there may be a pain clinic there near him. Patient states he is going to call us back after seeing Dr. Odis Luster and let us know where to go from there.

## 2023-01-24 DIAGNOSIS — M170 Bilateral primary osteoarthritis of knee: Secondary | ICD-10-CM | POA: Diagnosis not present

## 2023-02-07 DIAGNOSIS — M1712 Unilateral primary osteoarthritis, left knee: Secondary | ICD-10-CM | POA: Diagnosis not present

## 2023-03-14 ENCOUNTER — Ambulatory Visit (INDEPENDENT_AMBULATORY_CARE_PROVIDER_SITE_OTHER): Payer: 59 | Admitting: Physician Assistant

## 2023-03-14 ENCOUNTER — Encounter: Payer: Self-pay | Admitting: Physician Assistant

## 2023-03-14 VITALS — BP 118/81 | HR 72 | Temp 98.5°F | Ht 72.0 in | Wt 264.8 lb

## 2023-03-14 DIAGNOSIS — L509 Urticaria, unspecified: Secondary | ICD-10-CM

## 2023-03-14 DIAGNOSIS — R21 Rash and other nonspecific skin eruption: Secondary | ICD-10-CM

## 2023-03-14 MED ORDER — PREDNISONE 20 MG PO TABS
ORAL_TABLET | ORAL | 0 refills | Status: DC
Start: 2023-03-14 — End: 2023-08-20

## 2023-03-14 NOTE — Patient Instructions (Signed)
I recommend adding an antihistamine to your daily regimen  This includes medications like Claritin, Allegra, Zyrtec- the generics of these work very well and are usually less expensive

## 2023-03-14 NOTE — Progress Notes (Signed)
Acute Office Visit   Patient: Mark Sexton   DOB: 02/06/1956   67 y.o. Male  MRN: [id number] Visit Date: 03/14/2023  Today's healthcare provider: Oswaldo Conroy Chandrika Sandles, PA-C  Introduced myself to the patient as a Secondary school teacher and provided education on APPs in clinical practice.    Chief Complaint  Patient presents with   Sarcoptes Scabiei    Patient says he think he may have Scabies. Patient says in the past with his line of work, he has an issues with Scabies in the past. Patient first noticed around Monday or Tuesday. Patient says he is having some real bad itching and says today the area is very inflamed. Patient says he has tried lotions and soak baths and that is not helping. Patient says declines having any drainage from the area.    Subjective    HPI HPI     Sarcoptes Scabiei    Additional comments: Patient says he think he may have Scabies. Patient says in the past with his line of work, he has an issues with Scabies in the past. Patient first noticed around Monday or Tuesday. Patient says he is having some real bad itching and says today the area is very inflamed. Patient says he has tried lotions and soak baths and that is not helping. Patient says declines having any drainage from the area.       Last edited by Mark Sexton, CMA on 03/14/2023  8:28 AM.       Mark Sexton Duration:  days - started itching Tuesday  Location: trunk and legs  Itching: yes Burning: no Redness: yes Oozing: no Scaling: no Blisters: no Painful: no Fevers: no Change in detergents/soaps/personal care products: no He denies new foods, medications, or topicals  Recent illness: no Recent travel:no History of same: yes- reports this seems similar to Scabies that he has had in the past  Context: worse Alleviating factors: nothing Treatments attempted:lotion/moisturizer Shortness of breath: no  Throat/tongue swelling: no Myalgias/arthralgias: no    Medications: Outpatient Medications Prior to  Visit  Medication Sig   buprenorphine-naloxone (SUBOXONE) 2-0.5 mg SUBL SL tablet Place under the tongue.   No facility-administered medications prior to visit.    Review of Systems  Skin:  Positive for rash.       Objective    BP 118/81   Pulse 72   Temp 98.5 F (36.9 C) (Oral)   Ht 6' (1.829 m)   Wt 264 lb 12.8 oz (120.1 kg)   BMI 35.91 kg/m    Physical Exam Vitals reviewed.  Constitutional:      General: He is awake.     Appearance: Normal appearance. He is well-developed and well-groomed.  HENT:     Head: Normocephalic and atraumatic.  Eyes:     Extraocular Movements: Extraocular movements intact.     Conjunctiva/sclera: Conjunctivae normal.  Pulmonary:     Effort: Pulmonary effort is normal.  Musculoskeletal:        General: Normal range of motion.     Cervical back: Normal range of motion.  Skin:    General: Skin is warm and dry.     Findings: Rash present. Rash is macular and urticarial.     Comments: Lacy macular rash along back  Dermatographia present   Neurological:     General: No focal deficit present.     Mental Status: He is alert and oriented to person, place, and time.  Psychiatric:  Mood and Affect: Mood normal.        Behavior: Behavior normal. Behavior is cooperative.        Thought Content: Thought content normal.        Judgment: Judgment normal.       No results found for any visits on 03/14/23.  Assessment & Plan      No follow-ups on file.       Problem List Items Addressed This Visit   None Visit Diagnoses     Rash and nonspecific skin eruption    -  Primary Acute, new concern Rash is consistent with urticarial/ allergic dermatitis  Will start Prednisone taper and recommend antihistamine to further assist with symptoms  Reviewed ED and return precautions Follow up as needed for persistent or progressing symptoms     Relevant Medications   predniSONE (DELTASONE) 20 MG tablet   Urticaria       Relevant  Medications   predniSONE (DELTASONE) 20 MG tablet        No follow-ups on file.   I, Mark Carpenito E Xandria Gallaga, PA-C, have reviewed all documentation for this visit. The documentation on 03/14/23 for the exam, diagnosis, procedures, and orders are all accurate and complete.   Mark Sexton, MHS, PA-C [facility]

## 2023-04-15 IMAGING — DX DG KNEE COMPLETE 4+V*L*
5 series · 5 of 5 positions shown · non-contrast
Comparison: Left knee radiographs 12/27/2020

CLINICAL DATA: Worsening chronic left knee pain.

EXAM:
LEFT KNEE - COMPLETE 4+ VIEW

[knee ap]
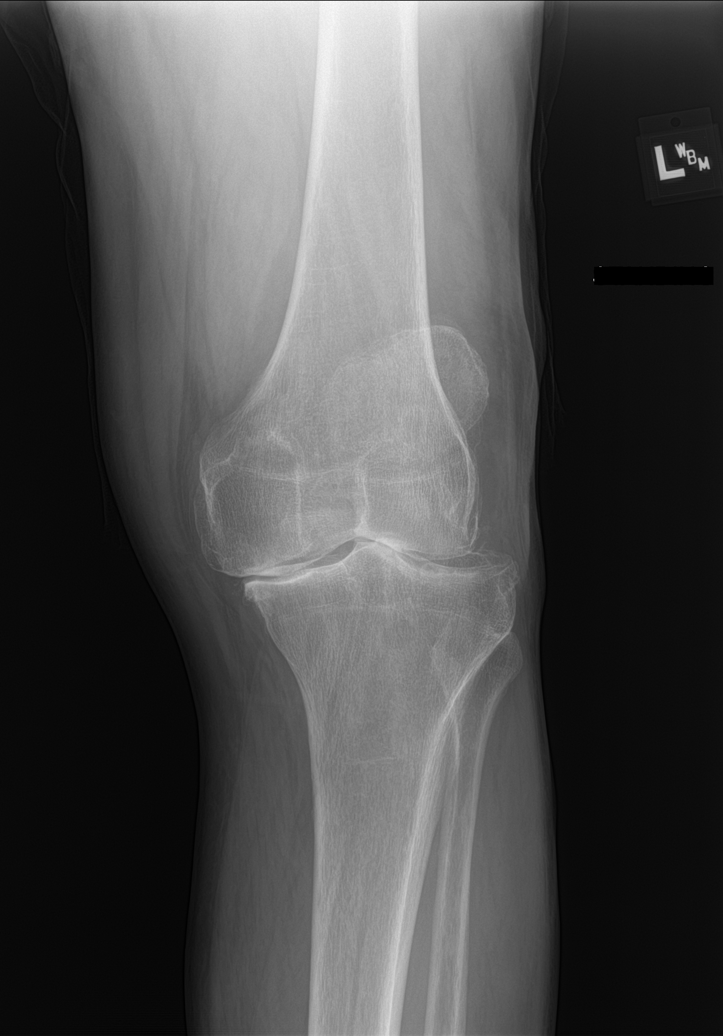

[knee lat]
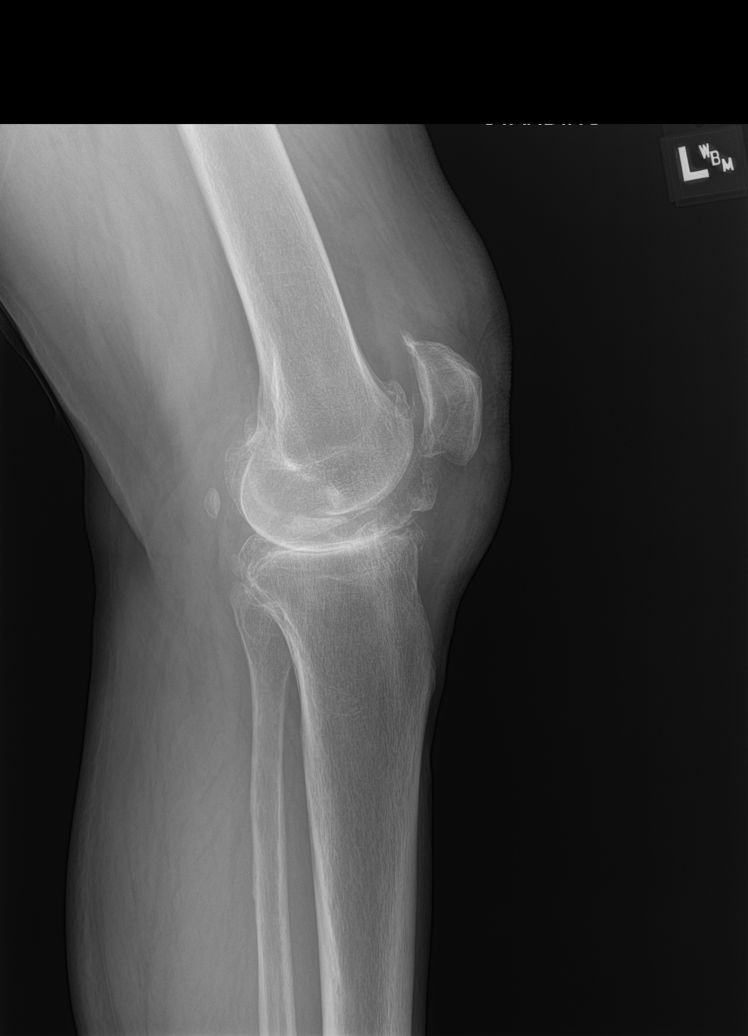

[knee tunnel (1 of 2)]
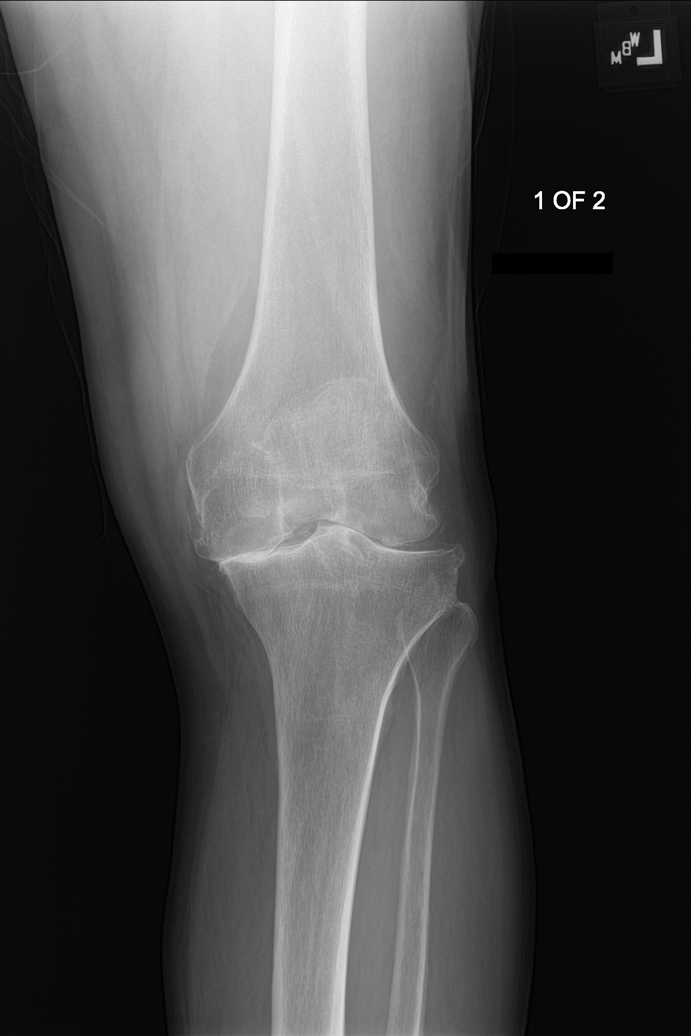

[patella skyline]
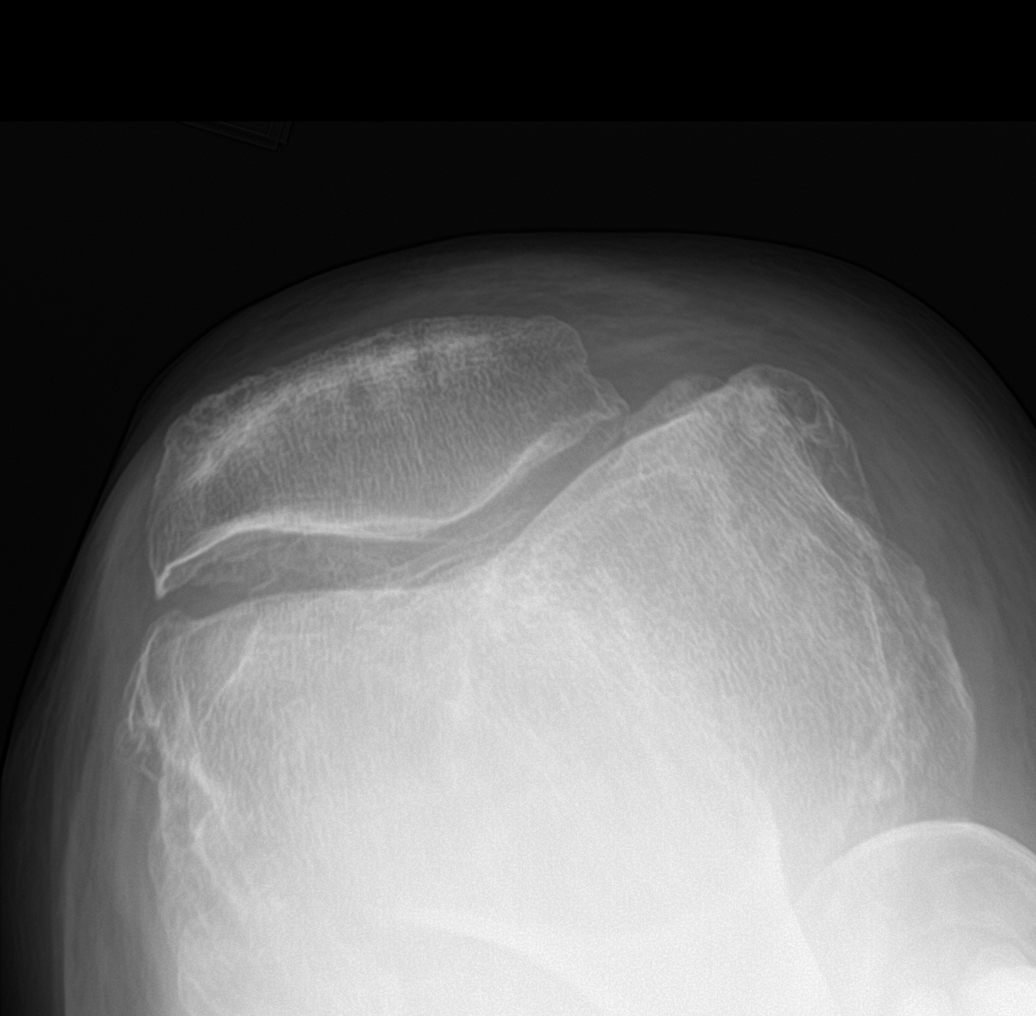

[knee tunnel (2 of 2)]
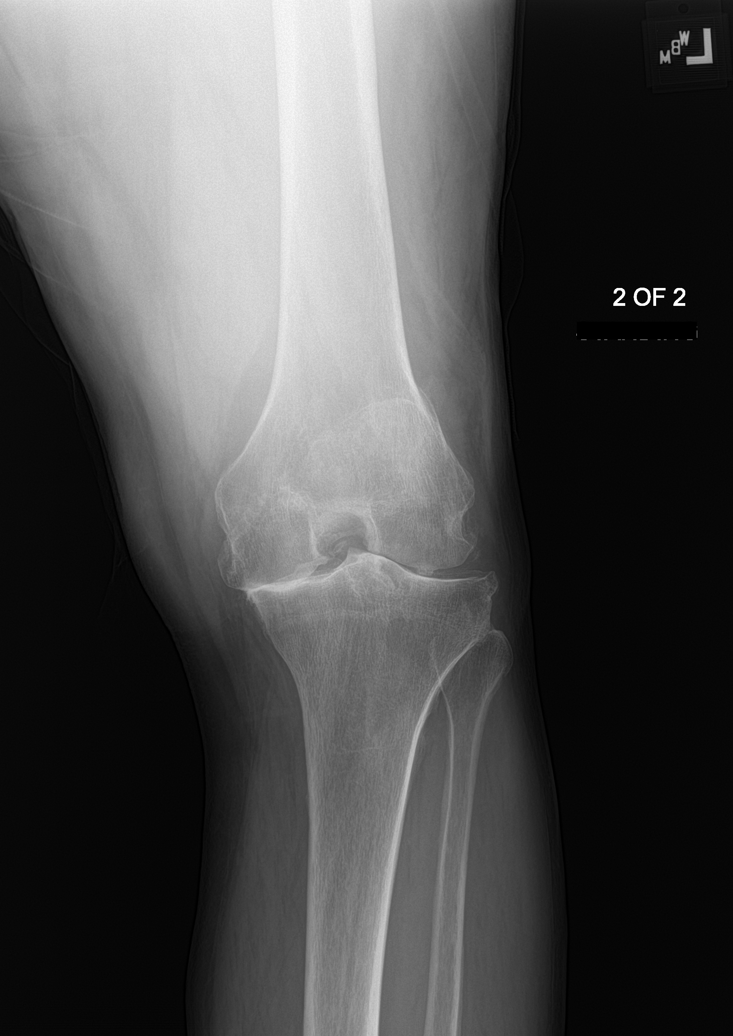

[5 of 5 positions shown; findings below may reference images not displayed]

FINDINGS: Severe medial and moderate lateral compartment joint space
narrowing. There is again mild lateral subluxation of the tibia.
Severe patellofemoral joint space narrowing and peripheral
osteophytosis. Mild-to-moderate joint effusion is unchanged. No
acute fracture is seen. No dislocation.
IMPRESSION: Severe patellofemoral and medial compartment and moderate lateral
compartment osteoarthritis.

## 2023-04-24 ENCOUNTER — Encounter: Payer: Self-pay | Admitting: Emergency Medicine

## 2023-05-06 ENCOUNTER — Ambulatory Visit (INDEPENDENT_AMBULATORY_CARE_PROVIDER_SITE_OTHER): Payer: 59 | Admitting: Emergency Medicine

## 2023-05-06 VITALS — Ht 72.0 in | Wt 260.0 lb

## 2023-05-06 DIAGNOSIS — Z0000 Encounter for general adult medical examination without abnormal findings: Secondary | ICD-10-CM

## 2023-05-06 DIAGNOSIS — Z0100 Encounter for examination of eyes and vision without abnormal findings: Secondary | ICD-10-CM

## 2023-05-06 DIAGNOSIS — Z1211 Encounter for screening for malignant neoplasm of colon: Secondary | ICD-10-CM

## 2023-05-06 NOTE — Progress Notes (Signed)
Subjective:   Mark Sexton is a 67 y.o. male who presents for Medicare Annual/Subsequent preventive examination.  Visit Complete: Virtual  I connected with  [name] on 05/06/23 by a audio enabled telemedicine application and verified that I am speaking with the correct person using two identifiers.  Patient Location: Home  Provider Location: Office/Clinic  I discussed the limitations of evaluation and management by telemedicine. The patient expressed understanding and agreed to proceed.  Vital Signs: Because this visit was a virtual/telehealth visit, some criteria may be missing or patient reported. Any vitals not documented were not able to be obtained and vitals that have been documented are patient reported.    Review of Systems     Cardiac Risk Factors include: advanced age (>34men, >75 women);male gender;obesity (BMI >30kg/m2)     Objective:    Today's Vitals   05/06/23 1030 05/06/23 1031  Weight: 260 lb (117.9 kg)   Height: 6' (1.829 m)   PainSc:  7    Body mass index is 35.26 kg/m.     05/06/2023   10:48 AM 04/18/2022   10:34 AM 09/11/2016   10:21 AM  Advanced Directives  Does Patient Have a Medical Advance Directive? No No No  Would patient like information on creating a medical advance directive? Yes (MAU/Ambulatory/Procedural Areas - Information given) No - Patient declined     Current Medications (verified) Outpatient Encounter Medications as of 05/06/2023  Medication Sig   buprenorphine-naloxone (SUBOXONE) 2-0.5 mg SUBL SL tablet Place under the tongue.   predniSONE (DELTASONE) 20 MG tablet Take 60mg  PO daily x 2 days, then40mg  PO daily x 2 days, then 20mg  PO daily x 3 days (Patient not taking: Reported on 05/06/2023)   No facility-administered encounter medications on file as of 05/06/2023.    Allergies (verified) Penicillins   History: Past Medical History:  Diagnosis Date   Allergy    Arthritis    History reviewed. No pertinent surgical  history. Family History  Problem Relation Age of Onset   Cancer Mother    COPD Father    Birth defects Maternal Grandmother    Social History   Socioeconomic History   Marital status: Divorced    Spouse name: Not on file   Number of children: 2   Years of education: Not on file   Highest education level: Not on file  Occupational History   Occupation: retired Music therapist  Tobacco Use   Smoking status: Former    Current packs/day: 0.00    Average packs/day: 0.6 packs/day for 38.0 years (22.8 ttl pk-yrs)    Types: Cigarettes    Start date: 11/26/1981    Quit date: 11/27/2019    Years since quitting: 3.4   Smokeless tobacco: Never  Vaping Use   Vaping status: Former   Quit date: 05/05/2020   Substances: Nicotine, Flavoring  Substance and Sexual Activity   Alcohol use: Yes    Alcohol/week: 42.0 standard drinks of alcohol    Types: 42 Cans of beer per week    Comment: 6 pack of beer daily   Drug use: Not Currently    Types: Marijuana    Comment: last use ~2005   Sexual activity: Yes  Other Topics Concern   Not on file  Social History Narrative   Has lived with girlfriend for 20 years, 2 children from previous relationships   Social Determinants of Health   Financial Resource Strain: Low Risk  (05/06/2023)   Overall Financial Resource Strain (CARDIA)  Difficulty of Paying Living Expenses: Not hard at all  Food Insecurity: No Food Insecurity (05/06/2023)   Hunger Vital Sign    Worried About Running Out of Food in the Last Year: Never true    Ran Out of Food in the Last Year: Never true  Transportation Needs: No Transportation Needs (05/06/2023)   PRAPARE - Administrator, Civil Service (Medical): No    Lack of Transportation (Non-Medical): No  Physical Activity: Insufficiently Active (05/06/2023)   Exercise Vital Sign    Days of Exercise per Week: 4 days    Minutes of Exercise per Session: 20 min  Stress: No Stress Concern Present (05/06/2023)   [employer] of [employer]ational [employer] - Occupational Stress Questionnaire    Feeling of Stress : Only a little  Social Connections: Socially Isolated (05/06/2023)   Social Connection and Isolation Panel [NHANES]    Frequency of Communication with Friends and Family: More than three times a week    Frequency of Social Gatherings with Friends and Family: Three times a week    Attends Religious Services: Never    Active Member of Clubs or Organizations: No    Attends Banker Meetings: Never    Marital Status: Divorced    Tobacco Counseling Counseling given: Not Answered   Clinical Intake:  Pre-visit preparation completed: Yes  Pain : 0-10 Pain Score: 7  Pain Type: Chronic pain Pain Location: Knee Pain Descriptors / Indicators: Aching     BMI - recorded: 35.26 Nutritional Status: BMI > 30  Obese Nutritional Risks: None Diabetes: No  How often do you need to have someone help you when you read instructions, pamphlets, or other written materials from your doctor or pharmacy?: 1 - Never  Interpreter Needed?: No  Information entered by :: Tora Kindred, CMA   Activities of Daily Living    05/06/2023   10:33 AM  In your present state of health, do you have any difficulty performing the following activities:  Hearing? 0  Vision? 0  Difficulty concentrating or making decisions? 0  Walking or climbing stairs? 1  Comment sometimes due to knee pain  Dressing or bathing? 0  Doing errands, shopping? 0  Preparing Food and eating ? N  Using the Toilet? N  In the past six months, have you accidently leaked urine? N  Do you have problems with loss of bowel control? N  Managing your Medications? N  Managing your Finances? N  Housekeeping or managing your Housekeeping? N    Patient Care Team: Larae Grooms, NP as PCP - General (Nurse Practitioner) Debbe Odea, MD as PCP - Cardiology (Cardiology)  Indicate any recent Medical Services you may have received  from other than [facility] providers in the past year (date may be approximate).     Assessment:   This is a routine wellness examination for Mark Sexton.  Hearing/Vision screen Hearing Screening - Comments:: Denies hearing loss Vision Screening - Comments:: Does not get routine eye exam. Referral placed.  Dietary issues and exercise activities discussed:     Goals Addressed               This Visit's Progress     COMPLETED: Patient Stated        Finish deck      Patient Stated (pt-stated)        Cut back on drinking and taking Suboxone      Depression Screen    05/06/2023   10:45 AM 12/19/2022  1:40 PM 05/29/2022    8:18 AM 04/18/2022   10:42 AM 02/26/2022    8:41 AM 02/19/2022    9:54 AM 01/16/2022    1:53 PM  PHQ 2/9 Scores  PHQ - 2 Score 0 0 2 0 0 0 0  PHQ- 9 Score 0 4 5  0 3 1    Fall Risk    05/06/2023   10:53 AM 12/19/2022    1:40 PM 05/29/2022    8:17 AM 04/18/2022   10:34 AM 02/26/2022    8:41 AM  Fall Risk   Falls in the past year? 0 0 1 0 0  Number falls in past yr: 0 0 0 0 0  Injury with Fall? 0 0 1 0 0  Risk for fall due to : No Fall Risks History of fall(s) History of fall(s)  History of fall(s)  Follow up Falls prevention discussed Falls evaluation completed Falls evaluation completed Falls evaluation completed;Education provided;Falls prevention discussed Falls evaluation completed    MEDICARE RISK AT HOME: Medicare Risk at Home Any stairs in or around the home?: Yes If so, are there any without handrails?: No Home free of loose throw rugs in walkways, pet beds, electrical cords, etc?: Yes Adequate lighting in your home to reduce risk of falls?: Yes Life alert?: No Use of a cane, walker or w/c?: No Grab bars in the bathroom?: No Shower chair or bench in shower?: No Elevated toilet seat or a handicapped toilet?: Yes  TIMED UP AND GO:  Was the test performed?  No    Cognitive Function:        05/06/2023   10:54 AM 04/18/2022   10:38 AM  6CIT  Screen  What Year? 0 points 0 points  What month? 3 points 0 points  What time? 0 points 0 points  Count back from 20 0 points 0 points  Months in reverse 0 points 0 points  Repeat phrase 0 points 0 points  Total Score 3 points 0 points    Immunizations Immunization History  Administered Date(s) Administered   Fluad Quad(high Dose 65+) 05/29/2022   Moderna Sars-Covid-2 Vaccination 12/02/2019, 12/30/2019   PNEUMOCOCCAL CONJUGATE-20 05/29/2022   Tdap 09/11/2016, 02/22/2021    TDAP status: Up to date  Flu Vaccine status: Due, Education has been provided regarding the importance of this vaccine. Advised may receive this vaccine at local pharmacy or Health Dept. Aware to provide a copy of the vaccination record if obtained from local pharmacy or Health Dept. Verbalized acceptance and understanding.  Pneumococcal vaccine status: Up to date  Covid-19 vaccine status: Information provided on how to obtain vaccines.   Qualifies for Shingles Vaccine? Yes   Zostavax completed No   Shingrix Completed?: No.    Education has been provided regarding the importance of this vaccine. Patient has been advised to call insurance company to determine out of pocket expense if they have not yet received this vaccine. Advised may also receive vaccine at local pharmacy or Health Dept. Verbalized acceptance and understanding.  Screening Tests Health Maintenance  Topic Date Due   Zoster Vaccines- Shingrix (1 of 2) Never done   Colonoscopy  Never done   COVID-19 Vaccine (3 - Moderna risk series) 01/27/2020   INFLUENZA VACCINE  04/10/2023   Lung Cancer Screening  10/31/2023   Medicare Annual Wellness (AWV)  05/05/2024   DTaP/Tdap/Td (3 - Td or Tdap) 02/23/2031   Pneumonia Vaccine 84+ Years old  Completed   Hepatitis C Screening  Completed  HPV VACCINES  Aged Out    Health Maintenance  Health Maintenance Due  Topic Date Due   Zoster Vaccines- Shingrix (1 of 2) Never done   Colonoscopy  Never done    COVID-19 Vaccine (3 - Moderna risk series) 01/27/2020   INFLUENZA VACCINE  04/10/2023    Colorectal cancer screening: Type of screening: Cologuard. Completed order placed. Repeat every 3 years  Lung Cancer Screening: (Low Dose CT Chest recommended if Age 49-80 years, 20 pack-year currently smoking OR have quit w/in 15years.) does qualify. (Last done 10/30/22) Lung Cancer Screening Referral: order placed 10/31/22  Additional Screening:  Hepatitis C Screening: does not qualify; Completed 05/29/22  Vision Screening: Recommended annual ophthalmology exams for early detection of glaucoma and other disorders of the eye.  Dental Screening: Recommended annual dental exams for proper oral hygiene   Community Resource Referral / Chronic Care Management: CRR required this visit?  No   CCM required this visit?  No     Plan:     I have personally reviewed and noted the following in the patient's chart:   Medical and social history Use of alcohol, tobacco or illicit drugs  Current medications and supplements including opioid prescriptions. Patient is not currently taking opioid prescriptions. Functional ability and status Nutritional status Physical activity Advanced directives List of other physicians Hospitalizations, surgeries, and ER visits in previous 12 months Vitals Screenings to include cognitive, depression, and falls Referrals and appointments  In addition, I have reviewed and discussed with patient certain preventive protocols, quality metrics, and best practice recommendations. A written personalized care plan for preventive services as well as general preventive health recommendations were provided to patient.     Tora Kindred, CMA   05/06/2023   After Visit Summary: (MyChart) Due to this being a telephonic visit, the after visit summary with patients personalized plan was offered to patient via MyChart   Nurse Notes:  6 CIT Score - 3 Placed Referral for routine  eye exam. Ordered cologuard

## 2023-05-06 NOTE — Patient Instructions (Addendum)
Mark Sexton , Thank you for taking time to come for your Medicare Wellness Visit. I appreciate your ongoing commitment to your health goals. Please review the following plan we discussed and let me know if I can assist you in the future.   Referrals/Orders/Follow-Ups/Clinician Recommendations: Get the flu shot this fall. Recommend getting the shingles vaccines. I have placed a referral for you to get a routine eye exam. I have order the Cologuard for  colon cancer screening.   This is a list of the screening recommended for you and due dates:  Health Maintenance  Topic Date Due   Zoster (Shingles) Vaccine (1 of 2) Never done   Colon Cancer Screening  Never done   COVID-19 Vaccine (3 - Moderna risk series) 01/27/2020   Flu Shot  04/10/2023   Screening for Lung Cancer  10/31/2023   Medicare Annual Wellness Visit  05/05/2024   DTaP/Tdap/Td vaccine (3 - Td or Tdap) 02/23/2031   Pneumonia Vaccine  Completed   Hepatitis C Screening  Completed   HPV Vaccine  Aged Out    Advanced directives: (ACP Link)Information on Advanced Care Planning can be found at [facility] Advance Health Care Directives Advance Health Care Directives ([email or web address]   Next Medicare Annual Wellness Visit scheduled for next year: Yes, 05/11/24 @ 10:30am

## 2023-05-09 DIAGNOSIS — M1712 Unilateral primary osteoarthritis, left knee: Secondary | ICD-10-CM | POA: Diagnosis not present

## 2023-05-21 DIAGNOSIS — Z1212 Encounter for screening for malignant neoplasm of rectum: Secondary | ICD-10-CM | POA: Diagnosis not present

## 2023-05-21 DIAGNOSIS — Z1211 Encounter for screening for malignant neoplasm of colon: Secondary | ICD-10-CM | POA: Diagnosis not present

## 2023-05-26 DIAGNOSIS — M1711 Unilateral primary osteoarthritis, right knee: Secondary | ICD-10-CM | POA: Diagnosis not present

## 2023-05-26 LAB — COLOGUARD: COLOGUARD: POSITIVE — AB

## 2023-05-27 ENCOUNTER — Telehealth: Payer: Self-pay

## 2023-05-27 ENCOUNTER — Other Ambulatory Visit: Payer: Self-pay

## 2023-05-27 DIAGNOSIS — Z1211 Encounter for screening for malignant neoplasm of colon: Secondary | ICD-10-CM

## 2023-05-27 DIAGNOSIS — R195 Other fecal abnormalities: Secondary | ICD-10-CM

## 2023-05-27 NOTE — Telephone Encounter (Signed)
Gastroenterology Pre-Procedure Review  Request Date: 06/23/23 Requesting Physician: Dr. Tobi Bastos  PATIENT REVIEW QUESTIONS: The patient responded to the following health history questions as indicated:    1. Are you having any GI issues?  Patient stated he had a positive cologuard last year but had to cancel colonoscopy this is his 1st colonoscopy 2. Do you have a personal history of Polyps? no 3. Do you have a family history of Colon Cancer or Polyps? no 4. Diabetes Mellitus? no 5. Joint replacements in the past 12 months?no 6. Major health problems in the past 3 months?no 7. Any artificial heart valves, MVP, or defibrillator?no    MEDICATIONS & ALLERGIES:    Patient reports the following regarding taking any anticoagulation/antiplatelet therapy:   Plavix, Coumadin, Eliquis, Xarelto, Lovenox, Pradaxa, Brilinta, or Effient? no Aspirin? no  Patient confirms/reports the following medications:  Current Outpatient Medications  Medication Sig Dispense Refill   buprenorphine-naloxone (SUBOXONE) 2-0.5 mg SUBL SL tablet Place under the tongue.     predniSONE (DELTASONE) 20 MG tablet Take 60mg  PO daily x 2 days, then40mg  PO daily x 2 days, then 20mg  PO daily x 3 days (Patient not taking: Reported on 05/06/2023) 13 tablet 0   No current facility-administered medications for this visit.    Patient confirms/reports the following allergies:  Allergies  Allergen Reactions   Penicillins Rash    No orders of the defined types were placed in this encounter.   AUTHORIZATION INFORMATION Primary Insurance: 1D#: Group #:  Secondary Insurance: 1D#: Group #:  SCHEDULE INFORMATION: Date: 06/23/23 Time: Location: [facility]

## 2023-05-27 NOTE — Telephone Encounter (Signed)
Pt requesting call back to schedule colonoscopy.

## 2023-06-01 ENCOUNTER — Other Ambulatory Visit: Payer: Self-pay | Admitting: Family Medicine

## 2023-06-01 DIAGNOSIS — R195 Other fecal abnormalities: Secondary | ICD-10-CM

## 2023-06-20 ENCOUNTER — Encounter: Payer: Self-pay | Admitting: Gastroenterology

## 2023-06-23 ENCOUNTER — Ambulatory Visit
Admission: RE | Admit: 2023-06-23 | Discharge: 2023-06-23 | Disposition: A | Discharge status: Home / Self Care | Payer: 59 | Attending: Gastroenterology | Admitting: Gastroenterology

## 2023-06-23 ENCOUNTER — Encounter: Payer: Self-pay | Admitting: Gastroenterology

## 2023-06-23 ENCOUNTER — Ambulatory Visit: Payer: 59 | Admitting: Certified Registered"

## 2023-06-23 ENCOUNTER — Encounter
Admission: RE | Disposition: A | Discharge status: Home / Self Care | Payer: Self-pay | Source: Home / Self Care | Attending: Gastroenterology

## 2023-06-23 ENCOUNTER — Other Ambulatory Visit: Payer: Self-pay

## 2023-06-23 DIAGNOSIS — D122 Benign neoplasm of ascending colon: Secondary | ICD-10-CM | POA: Diagnosis not present

## 2023-06-23 DIAGNOSIS — M1990 Unspecified osteoarthritis, unspecified site: Secondary | ICD-10-CM | POA: Insufficient documentation

## 2023-06-23 DIAGNOSIS — F17210 Nicotine dependence, cigarettes, uncomplicated: Secondary | ICD-10-CM | POA: Diagnosis not present

## 2023-06-23 DIAGNOSIS — D123 Benign neoplasm of transverse colon: Secondary | ICD-10-CM | POA: Diagnosis not present

## 2023-06-23 DIAGNOSIS — Z1211 Encounter for screening for malignant neoplasm of colon: Secondary | ICD-10-CM | POA: Insufficient documentation

## 2023-06-23 DIAGNOSIS — I4510 Unspecified right bundle-branch block: Secondary | ICD-10-CM | POA: Insufficient documentation

## 2023-06-23 DIAGNOSIS — D126 Benign neoplasm of colon, unspecified: Secondary | ICD-10-CM

## 2023-06-23 DIAGNOSIS — R195 Other fecal abnormalities: Secondary | ICD-10-CM | POA: Diagnosis not present

## 2023-06-23 DIAGNOSIS — G8929 Other chronic pain: Secondary | ICD-10-CM | POA: Diagnosis not present

## 2023-06-23 DIAGNOSIS — Z6835 Body mass index (BMI) 35.0-35.9, adult: Secondary | ICD-10-CM | POA: Insufficient documentation

## 2023-06-23 DIAGNOSIS — K635 Polyp of colon: Secondary | ICD-10-CM

## 2023-06-23 DIAGNOSIS — Z7952 Long term (current) use of systemic steroids: Secondary | ICD-10-CM | POA: Insufficient documentation

## 2023-06-23 DIAGNOSIS — E669 Obesity, unspecified: Secondary | ICD-10-CM | POA: Diagnosis not present

## 2023-06-23 DIAGNOSIS — D128 Benign neoplasm of rectum: Secondary | ICD-10-CM | POA: Diagnosis not present

## 2023-06-23 HISTORY — PX: POLYPECTOMY: SHX5525

## 2023-06-23 HISTORY — PX: SUBMUCOSAL LIFTING INJECTION: SHX6855

## 2023-06-23 HISTORY — PX: HEMOSTASIS CLIP PLACEMENT: SHX6857

## 2023-06-23 HISTORY — PX: COLONOSCOPY WITH PROPOFOL: SHX5780

## 2023-06-23 SURGERY — COLONOSCOPY WITH PROPOFOL
Anesthesia: General

## 2023-06-23 MED ORDER — LIDOCAINE HCL (PF) 2 % IJ SOLN
INTRAMUSCULAR | Status: DC | PRN
Start: 2023-06-23 — End: 2023-06-23
  Administered 2023-06-23: 100 mg via INTRADERMAL

## 2023-06-23 MED ORDER — SODIUM CHLORIDE 0.9 % IV SOLN: INTRAVENOUS | Status: DC

## 2023-06-23 MED ORDER — PROPOFOL 500 MG/50ML IV EMUL
INTRAVENOUS | Status: DC | PRN
Start: 2023-06-23 — End: 2023-06-23
  Administered 2023-06-23: 50 mg via INTRAVENOUS
  Administered 2023-06-23: 80 mg via INTRAVENOUS
  Administered 2023-06-23: 150 ug/kg/min via INTRAVENOUS

## 2023-06-23 NOTE — Transfer of Care (Signed)
Immediate Anesthesia Transfer of Care Note  Patient: Sopheap D Plato  Procedure(s) Performed: COLONOSCOPY WITH PROPOFOL  Patient Location: PACU  Anesthesia Type:General  Level of Consciousness: awake and alert   Airway & Oxygen Therapy: Patient Spontanous Breathing  Post-op Assessment: Report given to RN and Post -op Vital signs reviewed and stable  Post vital signs: stable  Last Vitals:  Vitals Value Taken Time  BP 142/113 06/23/23 1031  Temp    Pulse    Resp 14 06/23/23 1031  SpO2    Vitals shown include unfiled device data.  Last Pain:  Vitals:   06/23/23 0856  TempSrc: Temporal  PainSc: 0-No pain         Complications: No notable events documented.

## 2023-06-23 NOTE — Op Note (Addendum)
[facility] Gastroenterology Patient Name: Mark Sexton Procedure Date: 06/23/2023 9:39 AM MRN: [id number] Account #: [id number] Date of Birth: 1956-07-28 Admit Type: Outpatient Age: 67 Room: [facility] ENDO ROOM 2 Gender: Male Note Status: Finalized Instrument Name: Prentice Docker [id number] Procedure:             Colonoscopy Indications:           Screening for colorectal malignant neoplasm due to                         positive Cologuard test Providers:             Wyline Mood MD, MD Referring MD:          No Local Md, MD (Referring MD) Medicines:             Monitored Anesthesia Care Complications:         No immediate complications. Procedure:             Pre-Anesthesia Assessment:                        - Prior to the procedure, a History and Physical was                         performed, and patient medications, allergies and                         sensitivities were reviewed. The patient's tolerance                         of previous anesthesia was reviewed.                        - The risks and benefits of the procedure and the                         sedation options and risks were discussed with the                         patient. All questions were answered and informed                         consent was obtained.                        - ASA Grade Assessment: II - A patient with mild                         systemic disease.                        After obtaining informed consent, the colonoscope was                         passed under direct vision. Throughout the procedure,                         the patient's blood pressure, pulse, and oxygen                         saturations were  monitored continuously. The                         Colonoscope was introduced through the anus and                         advanced to the the cecum, identified by the                         appendiceal orifice. The colonoscopy was performed                         with  ease. The patient tolerated the procedure well.                         The quality of the bowel preparation was good. The                         ileocecal valve, appendiceal orifice, and rectum were                         photographed. Findings:      The perianal and digital rectal examinations were normal.      A 5 mm polyp was found in the transverse colon. The polyp was sessile.       The polyp was removed with a cold snare. Resection and retrieval were       complete.      Nine sessile polyps were found in the ascending colon. The polyps were 6       to 9 mm in size. These polyps were removed with a cold snare. Resection       and retrieval were complete.      An 8 mm polyp was found in the rectum. The polyp was sessile. The polyp       was removed with a hot snare. Resection and retrieval were complete.      Two semi-sessile polyps were found in the transverse colon and ascending       colon. The polyps were 15 to 18 mm in size. These polyps were removed       with a saline injection-lift technique using a hot snare. Resection and       retrieval were complete. To prevent bleeding after the polypectomy, two       hemostatic clips were successfully placed. Clip manufacturer: [employer]. There was no bleeding at the end of the procedure.      No additional abnormalities were found on retroflexion.      The exam was otherwise without abnormality on direct and retroflexion       views. Impression:            - One 5 mm polyp in the transverse colon, removed with                         a cold snare. Resected and retrieved.                        - Nine 6 to 9 mm polyps in the ascending colon,  removed with a cold snare. Resected and retrieved.                        - One 8 mm polyp in the rectum, removed with a hot                         snare. Resected and retrieved.                        - Two 15 to 18 mm polyps in the transverse colon and                          in the ascending colon, removed using injection-lift                         and a hot snare. Resected and retrieved. Clips were                         placed. Clip manufacturer: [employer].                        - The examination was otherwise normal on direct and                         retroflexion views. Recommendation:        - Discharge patient to home (with escort).                        - Resume previous diet.                        - Continue present medications.                        - Await pathology results.                        - Repeat colonoscopy in 1 year for surveillance. Procedure Code(s):     --- Professional ---                        [phone], Colonoscopy, flexible; with removal of                         tumor(s), polyp(s), or other lesion(s) by snare                         technique                        [id number], Colonoscopy, flexible; with directed submucosal                         injection(s), any substance Diagnosis Code(s):     --- Professional ---                        Z12.11, Encounter for screening for malignant neoplasm                         of colon  R19.5, Other fecal abnormalities                        D12.3, Benign neoplasm of transverse colon (hepatic                         flexure or splenic flexure)                        D12.8, Benign neoplasm of rectum                        D12.2, Benign neoplasm of ascending colon CPT copyright 2022 American Medical Association. All rights reserved. The codes documented in this report are preliminary and upon coder review may  be revised to meet current compliance requirements. Wyline Mood, MD Wyline Mood MD, MD 06/23/2023 10:29:29 AM This report has been signed electronically. Number of Addenda: 0 Note Initiated On: 06/23/2023 9:39 AM Scope Withdrawal Time: 0 hours 28 minutes 22 seconds  Total Procedure Duration: 0 hours 41 minutes 34 seconds   Estimated Blood Loss:  Estimated blood loss: none.      [facility]

## 2023-06-23 NOTE — Anesthesia Postprocedure Evaluation (Signed)
Anesthesia Post Note  Patient: Jhoan D Burback  Procedure(s) Performed: COLONOSCOPY WITH PROPOFOL  Patient location during evaluation: PACU Anesthesia Type: General Level of consciousness: awake and alert, oriented and patient cooperative Pain management: pain level controlled Vital Signs Assessment: post-procedure vital signs reviewed and stable Respiratory status: spontaneous breathing, nonlabored ventilation and respiratory function stable Cardiovascular status: blood pressure returned to baseline and stable Postop Assessment: adequate PO intake Anesthetic complications: no   There were no known notable events for this encounter.   Last Vitals:  Vitals:   06/23/23 1040 06/23/23 1050  BP: (!) 140/69 (!) 152/95  Pulse: 84 73  Resp:    Temp:    SpO2: 99% 100%    Last Pain:  Vitals:   06/23/23 1050  TempSrc:   PainSc: 0-No pain                 Reed Breech

## 2023-06-23 NOTE — Anesthesia Preprocedure Evaluation (Addendum)
Anesthesia Evaluation  Patient identified by MRN, date of birth, ID band Patient awake    Reviewed: Allergy & Precautions, NPO status , Patient's Chart, lab work & pertinent test results  History of Anesthesia Complications Negative for: history of anesthetic complications  Airway Mallampati: III   Neck ROM: Full    Dental  (+) Upper Dentures, Lower Dentures   Pulmonary former smoker (quit 3 years ago)   Pulmonary exam normal breath sounds clear to auscultation       Cardiovascular Normal cardiovascular exam Rhythm:Regular Rate:Normal  ECG 12/18/22: NSR, incomplete RBBB   Neuro/Psych Chronic pain    GI/Hepatic negative GI ROS,,,  Endo/Other  Obesity   Renal/GU negative Renal ROS     Musculoskeletal  (+) Arthritis ,    Abdominal   Peds  Hematology negative hematology ROS (+)   Anesthesia Other Findings   Reproductive/Obstetrics                             Anesthesia Physical Anesthesia Plan  ASA: 2  Anesthesia Plan: General   Post-op Pain Management:    Induction: Intravenous  PONV Risk Score and Plan: 1 and Propofol infusion, TIVA and Treatment may vary due to age or medical condition  Airway Management Planned: Natural Airway  Additional Equipment:   Intra-op Plan:   Post-operative Plan:   Informed Consent: I have reviewed the patients History and Physical, chart, labs and discussed the procedure including the risks, benefits and alternatives for the proposed anesthesia with the patient or authorized representative who has indicated his/her understanding and acceptance.       Plan Discussed with: CRNA  Anesthesia Plan Comments: (LMA/GETA backup discussed.  Patient consented for risks of anesthesia including but not limited to:  - adverse reactions to medications - damage to eyes, teeth, lips or other oral mucosa - nerve damage due to positioning  - sore throat or  hoarseness - damage to heart, brain, nerves, lungs, other parts of body or loss of life  Informed patient about role of CRNA in peri- and intra-operative care.  Patient voiced understanding.)        Anesthesia Quick Evaluation

## 2023-06-23 NOTE — H&P (Signed)
Wyline Mood, MD [address], [place], [place], [address] [address], [place], [place], [address] Phone: [phone]  Fax: [phone]  Primary Care Physician:  Larae Grooms, NP   Pre-Procedure History & Physical: HPI:  Mark Sexton is a 67 y.o. male is here for an colonoscopy.   Past Medical History:  Diagnosis Date   Allergy    Arthritis     History reviewed. No pertinent surgical history.  Prior to Admission medications   Medication Sig Start Date End Date Taking? Authorizing Provider  buprenorphine-naloxone (SUBOXONE) 2-0.5 mg SUBL SL tablet Place under the tongue.   Yes [provider]  predniSONE (DELTASONE) 20 MG tablet Take 60mg  PO daily x 2 days, then40mg  PO daily x 2 days, then 20mg  PO daily x 3 days Patient not taking: Reported on 05/06/2023 03/14/23   Mecum, Erin E, PA-C    Allergies as of 05/27/2023 - Review Complete 05/27/2023  Allergen Reaction Noted   Penicillins Rash 09/11/2016    Family History  Problem Relation Age of Onset   Cancer Mother    COPD Father    Birth defects Maternal Grandmother     Social History   Socioeconomic History   Marital status: Divorced    Spouse name: Not on file   Number of children: 2   Years of education: Not on file   Highest education level: Not on file  Occupational History   Occupation: retired Music therapist  Tobacco Use   Smoking status: Every Day    Current packs/day: 0.00    Average packs/day: 0.6 packs/day for 38.0 years (22.8 ttl pk-yrs)    Types: Cigarettes    Start date: 11/26/1981    Last attempt to quit: 11/27/2019    Years since quitting: 3.5   Smokeless tobacco: Never  Vaping Use   Vaping status: Former   Quit date: 05/05/2020   Substances: Nicotine, Flavoring  Substance and Sexual Activity   Alcohol use: Yes    Alcohol/week: 42.0 standard drinks of alcohol    Types: 42 Cans of beer per week    Comment: 6 pack of beer daily   Drug use: Not Currently     Types: Marijuana    Comment: last use ~2005   Sexual activity: Yes  Other Topics Concern   Not on file  Social History Narrative   Has lived with girlfriend for 20 years, 2 children from previous relationships   Social Determinants of Health   Financial Resource Strain: Low Risk  (05/06/2023)   Overall Financial Resource Strain (CARDIA)    Difficulty of Paying Living Expenses: Not hard at all  Food Insecurity: No Food Insecurity (05/06/2023)   Hunger Vital Sign    Worried About Running Out of Food in the Last Year: Never true    Ran Out of Food in the Last Year: Never true  Transportation Needs: No Transportation Needs (05/06/2023)   PRAPARE - Administrator, Civil Service (Medical): No    Lack of Transportation (Non-Medical): No  Physical Activity: Insufficiently Active (05/06/2023)   Exercise Vital Sign    Days of Exercise per Week: 4 days    Minutes of Exercise per Session: 20 min  Stress: No Stress Concern Present (05/06/2023)   [employer] of Occupational Health - Occupational Stress Questionnaire    Feeling of Stress : Only a little  Social Connections: Socially Isolated (05/06/2023)   Social Connection and Isolation Panel [NHANES]    Frequency  of Communication with Friends and Family: More than three times a week    Frequency of Social Gatherings with Friends and Family: Three times a week    Attends Religious Services: Never    Active Member of Clubs or Organizations: No    Attends Banker Meetings: Never    Marital Status: Divorced  Catering manager Violence: Not At Risk (05/06/2023)   Humiliation, Afraid, Rape, and Kick questionnaire    Fear of Current or Ex-Partner: No    Emotionally Abused: No    Physically Abused: No    Sexually Abused: No    Review of Systems: See HPI, otherwise negative ROS  Physical Exam: BP (!) 166/102   Pulse 89   Temp (!) 97.2 F (36.2 C) (Temporal)   Wt 120.2 kg   SpO2 98%   BMI 35.94 kg/m  General:    Alert,  pleasant and cooperative in NAD Head:  Normocephalic and atraumatic. Neck:  Supple; no masses or thyromegaly. Lungs:  Clear throughout to auscultation, normal respiratory effort.    Heart:  +S1, +S2, Regular rate and rhythm, No edema. Abdomen:  Soft, nontender and nondistended. Normal bowel sounds, without guarding, and without rebound.   Neurologic:  Alert and  oriented x4;  grossly normal neurologically.  Impression/Plan: Mark Sexton is here for an colonoscopy to be performed for positive cologuard. Risks, benefits, limitations, and alternatives regarding  colonoscopy have been reviewed with the patient.  Questions have been answered.  All parties agreeable.   Wyline Mood, MD  06/23/2023, 9:35 AM

## 2023-06-24 ENCOUNTER — Encounter: Payer: Self-pay | Admitting: Gastroenterology

## 2023-06-26 LAB — SURGICAL PATHOLOGY

## 2023-07-01 ENCOUNTER — Encounter: Payer: Self-pay | Admitting: Gastroenterology

## 2023-07-02 ENCOUNTER — Telehealth: Payer: Self-pay

## 2023-07-02 NOTE — Telephone Encounter (Signed)
-----   Message from Wyline Mood sent at 07/01/2023 11:31 AM EDT ----- Polyps were precancerous recommend colonoscopy in 1 year since numerous polyps were resected recommend genetic testing if agreeable refer to geneticist

## 2023-07-02 NOTE — Telephone Encounter (Signed)
Called patient but had to leave him a detailed message to call me back so I could give him results and Dr. Johnney Killian recommendations.

## 2023-07-03 NOTE — Telephone Encounter (Signed)
Called patient and left him a voicemail to call us back. I will also send him a message through MyChart letting him know what Dr. Tobi Bastos recommends for him and if he agrees on [place] sending a referral to the geneticist, then we will.

## 2023-07-04 ENCOUNTER — Inpatient Hospital Stay: Payer: 59 | Admitting: Family Medicine

## 2023-08-20 ENCOUNTER — Ambulatory Visit (INDEPENDENT_AMBULATORY_CARE_PROVIDER_SITE_OTHER): Payer: 59 | Admitting: Nurse Practitioner

## 2023-08-20 ENCOUNTER — Encounter: Payer: Self-pay | Admitting: Nurse Practitioner

## 2023-08-20 VITALS — BP 133/92 | HR 89 | Temp 98.1°F | Ht 72.0 in | Wt 275.8 lb

## 2023-08-20 DIAGNOSIS — G8929 Other chronic pain: Secondary | ICD-10-CM

## 2023-08-20 DIAGNOSIS — L509 Urticaria, unspecified: Secondary | ICD-10-CM

## 2023-08-20 DIAGNOSIS — G479 Sleep disorder, unspecified: Secondary | ICD-10-CM | POA: Diagnosis not present

## 2023-08-20 DIAGNOSIS — M25562 Pain in left knee: Secondary | ICD-10-CM | POA: Diagnosis not present

## 2023-08-20 MED ORDER — TRAZODONE HCL 50 MG PO TABS
25.0000 mg | ORAL_TABLET | Freq: Every evening | ORAL | 3 refills | Status: DC | PRN
Start: 2023-08-20 — End: 2023-11-07

## 2023-08-20 MED ORDER — PREDNISONE 20 MG PO TABS: ORAL_TABLET | ORAL | 0 refills | Status: DC

## 2023-08-20 NOTE — Assessment & Plan Note (Signed)
Chronic.  Will give prednisone to get patient to his visit with Ortho.  Follow up if not improved.

## 2023-08-20 NOTE — Progress Notes (Signed)
BP (!) 133/92 (BP Location: Left Arm, Patient Position: Sitting, Cuff Size: Large)   Pulse 89   Temp 98.1 F (36.7 C) (Oral)   Ht 6' (1.829 m)   Wt 275 lb 12.8 oz (125.1 kg)   SpO2 96%   BMI 37.41 kg/m    Subjective:    Patient ID: Mark Sexton, male    DOB: 01-31-1956, 67 y.o.   MRN: [id number]  HPI: Mark Sexton is a 67 y.o. male  Chief Complaint  Patient presents with   Knee Pain    Would like to discuss injections, both knees have pain, left over right    Insomnia    Would like to discuss sleep aide    KNEE PAIN Has been receiving knee injections with ortho. He prefers to get them there because they seem to work better.  He hasn't been able to get into ortho recently but needs help to get to his next appt.   SLEEP Patient states he has been having trouble sleeping.  He was having trouble getting comfortable.  He would like to try trazodone.  Has never tried any sleep aids before.   Relevant past medical, surgical, family and social history reviewed and updated as indicated. Interim medical history since our last visit reviewed. Allergies and medications reviewed and updated.  Review of Systems  Musculoskeletal:        Left knee pain  Psychiatric/Behavioral:  Positive for sleep disturbance.     Per HPI unless specifically indicated above     Objective:    BP (!) 133/92 (BP Location: Left Arm, Patient Position: Sitting, Cuff Size: Large)   Pulse 89   Temp 98.1 F (36.7 C) (Oral)   Ht 6' (1.829 m)   Wt 275 lb 12.8 oz (125.1 kg)   SpO2 96%   BMI 37.41 kg/m   Wt Readings from Last 3 Encounters:  08/20/23 275 lb 12.8 oz (125.1 kg)  06/23/23 265 lb (120.2 kg)  05/06/23 260 lb (117.9 kg)    Physical Exam Vitals and nursing note reviewed.  Constitutional:      General: He is not in acute distress.    Appearance: Normal appearance. He is not ill-appearing, toxic-appearing or diaphoretic.  HENT:     Head: Normocephalic.     Right Ear: External ear normal.      Left Ear: External ear normal.     Nose: Nose normal. No congestion or rhinorrhea.     Mouth/Throat:     Mouth: Mucous membranes are moist.  Eyes:     General:        Right eye: No discharge.        Left eye: No discharge.     Extraocular Movements: Extraocular movements intact.     Conjunctiva/sclera: Conjunctivae normal.     Pupils: Pupils are equal, round, and reactive to light.  Cardiovascular:     Rate and Rhythm: Normal rate and regular rhythm.     Heart sounds: No murmur heard. Pulmonary:     Effort: Pulmonary effort is normal. No respiratory distress.     Breath sounds: Normal breath sounds. No wheezing, rhonchi or rales.  Abdominal:     General: Abdomen is flat. Bowel sounds are normal.  Musculoskeletal:     Cervical back: Normal range of motion and neck supple.  Skin:    General: Skin is warm and dry.     Capillary Refill: Capillary refill takes less than 2 seconds.  Neurological:  General: No focal deficit present.     Mental Status: He is alert and oriented to person, place, and time.  Psychiatric:        Mood and Affect: Mood normal.        Behavior: Behavior normal.        Thought Content: Thought content normal.        Judgment: Judgment normal.     Results for orders placed or performed during the hospital encounter of 06/23/23  Surgical pathology  Result Value Ref Range   SURGICAL PATHOLOGY      SURGICAL PATHOLOGY [facility] [address], [address] [place], [place] [address] Telephone [phone] or [phone] Fax [phone]  REPORT OF SURGICAL PATHOLOGY   Accession #: [id number] Patient Name: Mark Sexton Visit # : [id number]  MRN: [id number] Physician: Wyline Mood DOB/Age [birth year] (Age: 9) Gender: M Collected Date: 06/23/2023 Received Date: 06/23/2023  FINAL DIAGNOSIS       1. Transverse Colon Polyp, Cold snare :       - SESSILE SERRATED POLYP WITHOUT CYTOLOGIC DYSPLASIA (MULTIPLE FRAGMENTS)      -  NEGATIVE FOR MALIGNANCY       2. Ascending  Colon Polyp, Cold snare x9 :       - TUBULAR ADENOMA(S) (4 FRAGMENTS)      - SESSILE SERRATED POLYP(S) WITHOUT CYTOLOGIC DYSPLASIA (MULTIPLE FRAGMENTS)      - COLONIC MUCOSA WITH LYMPHOID AGGREGATE (1 SMALL FRAGMENT)      - COLONIC MUCOSA WITH FOCAL SURFACE HYPERPLASTIC CHANGES (2 FRAGMENTS)      - NEGATIVE FOR HIGH-GRADE DYSPLASIA OR MALIGNANCY       3. Ascending  Colon Poly p, Hot snare :       - TUBULAR ADENOMA      - NEGATIVE FOR HIGH-GRADE DYSPLASIA OR MALIGNANCY      - CAUTERIZED BASE MARGIN IS NEGATIVE FOR DYSPLASIA       4. Transverse Colon Polyp, Hot snare :       - TUBULAR ADENOMA (MULTIPLE FRAGMENTS)      - NEGATIVE FOR HIGH-GRADE DYSPLASIA OR MALIGNANCY       5. Rectum, polyp(s), Hot snare :       - TUBULAR ADENOMA      - NEGATIVE FOR HIGH-GRADE DYSPLASIA OR MALIGNANCY       ELECTRONIC SIGNATURE : [place] M.D., Dossie Arbour., Pathologist, Electronic Signature  MICROSCOPIC DESCRIPTION  CASE COMMENTS STAINS USED IN DIAGNOSIS: H&E H&E H&E H&E H&E    CLINICAL HISTORY  SPECIMEN(S) OBTAINED 1. Transverse Colon Polyp, Cold Snare 2. Ascending  Colon Polyp, Cold Snare X9 3. Ascending  Colon Polyp, Hot Snare 4. Transverse Colon Polyp, Hot Snare 5. Rectum, polyp(s), Hot Snare  SPECIMEN COMMENTS: SPECIMEN CLINICAL INFORMATION: 1. Screening colonoscopy.Positive cologuard.Polyps    Gross Description 1. "Transverse  colon polyp cold snare", received in formalin is a 1.2 x 0.9 x 0.1 cm aggregate of 3 gray-tan tissue fragments and food like material.The specimen is filtered and submitted in toto in 1 block (1A). 2. "Ascending colon polyp x9 cold snare", received in formalin is a 2.7 x 1.8 x 0.2 cm aggregate of multiple gray-tan tissue fragments and food like material.The specimen is filtered and submitted in toto in 1 block (2A). 3. "Ascending colon polyp hot snare", received in formalin is a single 1.2 x 1.5 x 0.7 cm  pink-red tissue fragment.The presumed resection margin is inked blue.The specimen is submitted entirely in 1 block (3A). 4. "  Transverse colon polyp hot snare", received in formalin is a 2.0 x 0.8 x 0.2 cm aggregate of multiple white-pink tissue fragments and food like material.The specimen is filtered and submitted in toto in 1 block (4A). 5. "Rectum polyp hot snare", received in formalin is a single 0.5 x 0.4 x 0.3 cm pink-tan tissue fragment.The specimen is submitted in  toto in 1 block (5A).      [facility] 06/23/2023        Report signed out from the following location(s) [place]. [facility] [address], [place] [address] CLIA #: [id number]  [facility] [address] [place], [place] [address] CLIA #: [id number]       Assessment & Plan:   Problem List Items Addressed This Visit       Other   Chronic pain of left knee - Primary    Chronic.  Will give prednisone to get patient to his visit with Ortho.  Follow up if not improved.       Relevant Medications   predniSONE (DELTASONE) 20 MG tablet   traZODone (DESYREL) 50 MG tablet   Other Visit Diagnoses     Sleep disturbance       Will start trazodone.  Discussed how to take medication.  Discussed side effects and benefits of medication during visit.  Follow up in 2 months.   Urticaria       Relevant Medications   predniSONE (DELTASONE) 20 MG tablet        Follow up plan: Return in about 2 months (around 10/21/2023) for Physical and Fasting labs.

## 2023-08-25 DIAGNOSIS — M170 Bilateral primary osteoarthritis of knee: Secondary | ICD-10-CM | POA: Diagnosis not present

## 2023-09-04 ENCOUNTER — Other Ambulatory Visit: Payer: Self-pay | Admitting: Acute Care

## 2023-09-04 DIAGNOSIS — Z122 Encounter for screening for malignant neoplasm of respiratory organs: Secondary | ICD-10-CM

## 2023-09-04 DIAGNOSIS — Z87891 Personal history of nicotine dependence: Secondary | ICD-10-CM

## 2023-10-21 ENCOUNTER — Encounter: Payer: Self-pay | Admitting: Nurse Practitioner

## 2023-11-04 ENCOUNTER — Ambulatory Visit
Admission: RE | Admit: 2023-11-04 | Discharge: 2023-11-04 | Disposition: A | Discharge status: Home / Self Care | Payer: 59 | Source: Ambulatory Visit | Attending: Acute Care | Admitting: Acute Care

## 2023-11-04 DIAGNOSIS — Z87891 Personal history of nicotine dependence: Secondary | ICD-10-CM | POA: Insufficient documentation

## 2023-11-04 DIAGNOSIS — Z122 Encounter for screening for malignant neoplasm of respiratory organs: Secondary | ICD-10-CM | POA: Insufficient documentation

## 2023-11-07 ENCOUNTER — Ambulatory Visit: Payer: 59 | Admitting: Nurse Practitioner

## 2023-11-07 ENCOUNTER — Encounter: Payer: Self-pay | Admitting: Nurse Practitioner

## 2023-11-07 VITALS — BP 128/82 | HR 83 | Temp 97.8°F | Ht 71.65 in | Wt 275.2 lb

## 2023-11-07 DIAGNOSIS — R7309 Other abnormal glucose: Secondary | ICD-10-CM

## 2023-11-07 DIAGNOSIS — Z136 Encounter for screening for cardiovascular disorders: Secondary | ICD-10-CM

## 2023-11-07 DIAGNOSIS — G479 Sleep disorder, unspecified: Secondary | ICD-10-CM | POA: Diagnosis not present

## 2023-11-07 DIAGNOSIS — G894 Chronic pain syndrome: Secondary | ICD-10-CM

## 2023-11-07 DIAGNOSIS — Z0000 Encounter for general adult medical examination without abnormal findings: Secondary | ICD-10-CM | POA: Diagnosis not present

## 2023-11-07 DIAGNOSIS — R972 Elevated prostate specific antigen [PSA]: Secondary | ICD-10-CM

## 2023-11-07 DIAGNOSIS — Z23 Encounter for immunization: Secondary | ICD-10-CM

## 2023-11-07 LAB — URINALYSIS, ROUTINE W REFLEX MICROSCOPIC
Bilirubin, UA: NEGATIVE
Glucose, UA: NEGATIVE
Ketones, UA: NEGATIVE
Leukocytes,UA: NEGATIVE
Nitrite, UA: NEGATIVE
Protein,UA: NEGATIVE
RBC, UA: NEGATIVE
Specific Gravity, UA: >1.03 — ABNORMAL HIGH (ref 1.005–1.030)
Urobilinogen, Ur: 0.2 mg/dL (ref 0.2–1.0)
pH, UA: 5.5 (ref 5.0–7.5)

## 2023-11-07 MED ORDER — MELOXICAM 15 MG PO TABS: 15.0000 mg | ORAL_TABLET | Freq: Every day | ORAL | 1 refills | Status: DC

## 2023-11-07 MED ORDER — TRAZODONE HCL 100 MG PO TABS: 200.0000 mg | ORAL_TABLET | Freq: Every evening | ORAL | 1 refills | Status: DC | PRN

## 2023-11-07 NOTE — Progress Notes (Signed)
 BP 128/82 (BP Location: Left Arm, Cuff Size: Normal)   Pulse 83   Temp 97.8 F (36.6 C) (Oral)   Ht 5' 11.65" (1.82 m)   Wt 275 lb 3.2 oz (124.8 kg)   SpO2 95%   BMI 37.69 kg/m    Subjective:    Patient ID: Mark Sexton, male    DOB: 06/11/1956, 68 y.o.   MRN: [id number]  HPI: Mark Sexton is a 68 y.o. male presenting on 11/07/2023 for comprehensive medical examination. 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  He is ready to get his knee replaced.  Needs a UDS to do show that he is off the medication.  He has been off the medication for 3 months.  He does need a refill of meloxicam.  He is having trouble sleeping.  Only sleeping 3 hours per night.  Is taking 100mg  currently.  Has to take 3 a times to help with his pain.   Depression Screen done today and results listed below:     08/20/2023    3:00 PM 05/06/2023   10:45 AM 12/19/2022    1:40 PM 05/29/2022    8:18 AM 04/18/2022   10:42 AM  Depression screen PHQ 2/9  Decreased Interest 1 0 0 1 0  Down, Depressed, Hopeless 1 0 0 1 0  PHQ - 2 Score 2 0 0 2 0  Altered sleeping 2 0 0 2   Tired, decreased energy 2 0 2 1   Change in appetite 0 0 0 0   Feeling bad or failure about yourself  1 0 1 0   Trouble concentrating 1 0 1 0   Moving slowly or fidgety/restless 0 0  0   Suicidal thoughts 0 0 0 0   PHQ-9 Score 8 0 4 5   Difficult doing work/chores  Not difficult at all  Not difficult at all     The patient does not have a history of falls. I did complete a risk assessment for falls. A plan of care for falls was documented.   Past Medical History:  Past Medical History:  Diagnosis Date   Allergy    Arthritis     Surgical History:  Past Surgical History:  Procedure Laterality Date   COLONOSCOPY WITH PROPOFOL N/A 06/23/2023   Procedure: COLONOSCOPY WITH PROPOFOL;  Surgeon: Wyline Mood, MD;   Location: [facility] ENDOSCOPY;  Service: Gastroenterology;  Laterality: N/A;   HEMOSTASIS CLIP PLACEMENT  06/23/2023   Procedure: HEMOSTASIS CLIP PLACEMENT;  Surgeon: Wyline Mood, MD;  Location: [facility] ENDOSCOPY;  Service: Gastroenterology;;   POLYPECTOMY  06/23/2023   Procedure: POLYPECTOMY;  Surgeon: Wyline Mood, MD;  Location: [facility] ENDOSCOPY;  Service: Gastroenterology;;   Sunnie Nielsen LIFTING INJECTION  06/23/2023   Procedure: SUBMUCOSAL LIFTING INJECTION;  Surgeon: Wyline Mood, MD;  Location: [facility] ENDOSCOPY;  Service: Gastroenterology;;    Medications:  No current outpatient medications on file prior to visit.   No current facility-administered medications on file prior to visit.    Allergies:  Allergies  Allergen Reactions   Penicillins Rash    Social History:  Social History   Socioeconomic History   Marital status: Divorced    Spouse name: Not on file   Number of children: 2   Years of education: Not on file   Highest education level: Not on file  Occupational History  Occupation: retired Music therapist  Tobacco Use   Smoking status: Former    Current packs/day: 0.00    Average packs/day: 0.6 packs/day for 38.0 years (22.8 ttl pk-yrs)    Types: Cigarettes    Start date: 11/26/1981    Quit date: 11/27/2019    Years since quitting: 3.9   Smokeless tobacco: Never  Vaping Use   Vaping status: Former   Quit date: 05/05/2020   Substances: Nicotine, Flavoring  Substance and Sexual Activity   Alcohol use: Yes    Alcohol/week: 42.0 standard drinks of alcohol    Types: 42 Cans of beer per week    Comment: 6 pack of beer daily   Drug use: Not Currently    Types: Marijuana    Comment: last use ~2005   Sexual activity: Yes  Other Topics Concern   Not on file  Social History Narrative   Has lived with girlfriend for 20 years, 2 children from previous relationships   Social Drivers of Corporate investment banker Strain: Low Risk  (05/06/2023)   Overall Financial Resource Strain  (CARDIA)    Difficulty of Paying Living Expenses: Not hard at all  Food Insecurity: No Food Insecurity (05/06/2023)   Hunger Vital Sign    Worried About Running Out of Food in the Last Year: Never true    Ran Out of Food in the Last Year: Never true  Transportation Needs: No Transportation Needs (05/06/2023)   PRAPARE - Administrator, Civil Service (Medical): No    Lack of Transportation (Non-Medical): No  Physical Activity: Insufficiently Active (05/06/2023)   Exercise Vital Sign    Days of Exercise per Week: 4 days    Minutes of Exercise per Session: 20 min  Stress: No Stress Concern Present (05/06/2023)   [employer] of Occupational Health - Occupational Stress Questionnaire    Feeling of Stress : Only a little  Social Connections: Socially Isolated (05/06/2023)   Social Connection and Isolation Panel [NHANES]    Frequency of Communication with Friends and Family: More than three times a week    Frequency of Social Gatherings with Friends and Family: Three times a week    Attends Religious Services: Never    Active Member of Clubs or Organizations: No    Attends Banker Meetings: Never    Marital Status: Divorced  Catering manager Violence: Not At Risk (05/06/2023)   Humiliation, Afraid, Rape, and Kick questionnaire    Fear of Current or Ex-Partner: No    Emotionally Abused: No    Physically Abused: No    Sexually Abused: No   Social History   Tobacco Use  Smoking Status Former   Current packs/day: 0.00   Average packs/day: 0.6 packs/day for 38.0 years (22.8 ttl pk-yrs)   Types: Cigarettes   Start date: 11/26/1981   Quit date: 11/27/2019   Years since quitting: 3.9  Smokeless Tobacco Never   Social History   Substance and Sexual Activity  Alcohol Use Yes   Alcohol/week: 42.0 standard drinks of alcohol   Types: 42 Cans of beer per week   Comment: 6 pack of beer daily    Family History:  Family History  Problem Relation Age of Onset    Cancer Mother    COPD Father    Birth defects Maternal Grandmother     Past medical history, surgical history, medications, allergies, family history and social history reviewed with patient today and changes made to appropriate areas of the chart.  Review of Systems  Musculoskeletal:  Positive for back pain.       Knee pain  Psychiatric/Behavioral:  The patient has insomnia.    All other ROS negative except what is listed above and in the HPI.      Objective:    BP 128/82 (BP Location: Left Arm, Cuff Size: Normal)   Pulse 83   Temp 97.8 F (36.6 C) (Oral)   Ht 5' 11.65" (1.82 m)   Wt 275 lb 3.2 oz (124.8 kg)   SpO2 95%   BMI 37.69 kg/m   Wt Readings from Last 3 Encounters:  11/07/23 275 lb 3.2 oz (124.8 kg)  08/20/23 275 lb 12.8 oz (125.1 kg)  06/23/23 265 lb (120.2 kg)    Physical Exam Vitals and nursing note reviewed.  Constitutional:      General: He is not in acute distress.    Appearance: Normal appearance. He is obese. He is not ill-appearing, toxic-appearing or diaphoretic.  HENT:     Head: Normocephalic.     Right Ear: Tympanic membrane, ear canal and external ear normal.     Left Ear: Tympanic membrane, ear canal and external ear normal.     Nose: Nose normal. No congestion or rhinorrhea.     Mouth/Throat:     Mouth: Mucous membranes are moist.  Eyes:     General:        Right eye: No discharge.        Left eye: No discharge.     Extraocular Movements: Extraocular movements intact.     Conjunctiva/sclera: Conjunctivae normal.     Pupils: Pupils are equal, round, and reactive to light.  Cardiovascular:     Rate and Rhythm: Normal rate and regular rhythm.     Heart sounds: No murmur heard. Pulmonary:     Effort: Pulmonary effort is normal. No respiratory distress.     Breath sounds: Normal breath sounds. No wheezing, rhonchi or rales.  Abdominal:     General: Abdomen is flat. Bowel sounds are normal. There is no distension.     Palpations: Abdomen is  soft.     Tenderness: There is no abdominal tenderness. There is no guarding.  Musculoskeletal:     Cervical back: Normal range of motion and neck supple.  Skin:    General: Skin is warm and dry.     Capillary Refill: Capillary refill takes less than 2 seconds.  Neurological:     General: No focal deficit present.     Mental Status: He is alert and oriented to person, place, and time.     Cranial Nerves: No cranial nerve deficit.     Motor: No weakness.     Deep Tendon Reflexes: Reflexes normal.  Psychiatric:        Mood and Affect: Mood normal.        Behavior: Behavior normal.        Thought Content: Thought content normal.        Judgment: Judgment normal.     Results for orders placed or performed during the hospital encounter of 06/23/23  Surgical pathology   Collection Time: 06/23/23 12:00 AM  Result Value Ref Range   SURGICAL PATHOLOGY      SURGICAL PATHOLOGY [facility] [address], [address] [place], [place] [address] Telephone [phone] or [phone] Fax [phone]  REPORT OF SURGICAL PATHOLOGY   Accession #: [id number] Patient Name: ZAYD, BONET Visit # : [id number]  MRN: [id number] Physician: Tobi Bastos,  Kiran DOB/Age May 11, 1956 (Age: 74) Gender: M Collected Date: 06/23/2023 Received Date: 06/23/2023  FINAL DIAGNOSIS       1. Transverse Colon Polyp, Cold snare :       - SESSILE SERRATED POLYP WITHOUT CYTOLOGIC DYSPLASIA (MULTIPLE FRAGMENTS)      - NEGATIVE FOR MALIGNANCY       2. Ascending  Colon Polyp, Cold snare x9 :       - TUBULAR ADENOMA(S) (4 FRAGMENTS)      - SESSILE SERRATED POLYP(S) WITHOUT CYTOLOGIC DYSPLASIA (MULTIPLE FRAGMENTS)      - COLONIC MUCOSA WITH LYMPHOID AGGREGATE (1 SMALL FRAGMENT)      - COLONIC MUCOSA WITH FOCAL SURFACE HYPERPLASTIC CHANGES (2 FRAGMENTS)      - NEGATIVE FOR HIGH-GRADE DYSPLASIA OR MALIGNANCY       3. Ascending  Colon Poly p, Hot snare :       - TUBULAR ADENOMA      - NEGATIVE FOR  HIGH-GRADE DYSPLASIA OR MALIGNANCY      - CAUTERIZED BASE MARGIN IS NEGATIVE FOR DYSPLASIA       4. Transverse Colon Polyp, Hot snare :       - TUBULAR ADENOMA (MULTIPLE FRAGMENTS)      - NEGATIVE FOR HIGH-GRADE DYSPLASIA OR MALIGNANCY       5. Rectum, polyp(s), Hot snare :       - TUBULAR ADENOMA      - NEGATIVE FOR HIGH-GRADE DYSPLASIA OR MALIGNANCY       ELECTRONIC SIGNATURE : Kanteti M.D., Dossie Arbour., Pathologist, Electronic Signature  MICROSCOPIC DESCRIPTION  CASE COMMENTS STAINS USED IN DIAGNOSIS: H&E H&E H&E H&E H&E    CLINICAL HISTORY  SPECIMEN(S) OBTAINED 1. Transverse Colon Polyp, Cold Snare 2. Ascending  Colon Polyp, Cold Snare X9 3. Ascending  Colon Polyp, Hot Snare 4. Transverse Colon Polyp, Hot Snare 5. Rectum, polyp(s), Hot Snare  SPECIMEN COMMENTS: SPECIMEN CLINICAL INFORMATION: 1. Screening colonoscopy.Positive cologuard.Polyps    Gross Description 1. "Transverse  colon polyp cold snare", received in formalin is a 1.2 x 0.9 x 0.1 cm aggregate of 3 gray-tan tissue fragments and food like material.The specimen is filtered and submitted in toto in 1 block (1A). 2. "Ascending colon polyp x9 cold snare", received in formalin is a 2.7 x 1.8 x 0.2 cm aggregate of multiple gray-tan tissue fragments and food like material.The specimen is filtered and submitted in toto in 1 block (2A). 3. "Ascending colon polyp hot snare", received in formalin is a single 1.2 x 1.5 x 0.7 cm pink-red tissue fragment.The presumed resection margin is inked blue.The specimen is submitted entirely in 1 block (3A). 4. "Transverse colon polyp hot snare", received in formalin is a 2.0 x 0.8 x 0.2 cm aggregate of multiple white-pink tissue fragments and food like material.The specimen is filtered and submitted in toto in 1 block (4A). 5. "Rectum polyp hot snare", received in formalin is a single 0.5 x 0.4 x 0.3 cm pink-tan tissue fragment.The specimen is submitted in  toto in  1 block (5A).      [facility] 06/23/2023        Report signed out from the following location(s) . [facility] [address], [place] [address] CLIA #: [id number]  [facility] [address] [place], [place] [address] CLIA #: [id number]       Assessment & Plan:   Problem List Items Addressed This Visit       Other   Chronic pain  syndrome (Chronic)   Chronic.  Controlled with Meloxicam.  Patient is now off Suboxone so he can get his knee replaced.  Will check UDS at visit today and send to Dr. Odis Luster so he can get his surgery scheduled.  Handicap placard completed for patient during visit.       Relevant Medications   traZODone (DESYREL) 100 MG tablet   meloxicam (MOBIC) 15 MG tablet   Other Relevant Orders   [id number] 11+Oxyco+Alc+Crt-Bund   Other Visit Diagnoses       Annual physical exam    -  Primary   Health maintenance reviewed during visit today.  Labs ordered.  Vaccines reviewed.  Colonoscopy up to date.   Relevant Orders   TSH   PSA   Lipid panel   CBC with Differential/Platelet   Comprehensive metabolic panel   Urinalysis, Routine w reflex microscopic     Screening for ischemic heart disease       Relevant Orders   Lipid panel     Elevated glucose       Relevant Orders   HgB A1c     Need for influenza vaccination       Relevant Orders   Flu Vaccine Trivalent High Dose (Fluad) (Completed)     Need for COVID-19 vaccine       Relevant Orders   Pfizer Comirnaty Covid -19 Vaccine 40yrs and older (Completed)     Sleep disturbance       Not well controlled.  Will increase Trazodone to 200mg . Patient is already taking 150mg  daily.  Follow up in 6 months.  Call sooner if concerns arise.        Discussed aspirin prophylaxis for myocardial infarction prevention and decision was it was not indicated  LABORATORY TESTING:  Health maintenance labs ordered today as discussed above.   The natural history of prostate cancer  and ongoing controversy regarding screening and potential treatment outcomes of prostate cancer has been discussed with the patient. The meaning of a false positive PSA and a false negative PSA has been discussed. He indicates understanding of the limitations of this screening test and wishes to proceed with screening PSA testing.   IMMUNIZATIONS:   - Tdap: Tetanus vaccination status reviewed: last tetanus booster within 10 years. - Influenza: Administered today - Pneumovax: Up to date - Prevnar: Up to date - COVID: Administered today - HPV: Not applicable - Shingrix vaccine:  Will get at next visit  SCREENING: - Colonoscopy: Up to date  Discussed with patient purpose of the colonoscopy is to detect colon cancer at curable precancerous or early stages   - AAA Screening: Not applicable  -Hearing Test: Not applicable  -Spirometry: Not applicable   PATIENT COUNSELING:    Sexuality: Discussed sexually transmitted diseases, partner selection, use of condoms, avoidance of unintended pregnancy  and contraceptive alternatives.   Advised to avoid cigarette smoking.  I discussed with the patient that most people either abstain from alcohol or drink within safe limits (<=14/week and <=4 drinks/occasion for males, <=7/weeks and <= 3 drinks/occasion for females) and that the risk for alcohol disorders and other health effects rises proportionally with the number of drinks per week and how often a drinker exceeds daily limits.  Discussed cessation/primary prevention of drug use and availability of treatment for abuse.   Diet: Encouraged to adjust caloric intake to maintain  or achieve ideal body weight, to reduce intake of dietary saturated fat and total fat, to limit sodium intake by avoiding high  sodium foods and not adding table salt, and to maintain adequate dietary potassium and calcium preferably from fresh fruits, vegetables, and low-fat dairy products.    stressed the importance of regular  exercise  Injury prevention: Discussed safety belts, safety helmets, smoke detector, smoking near bedding or upholstery.   Dental health: Discussed importance of regular tooth brushing, flossing, and dental visits.   Follow up plan: NEXT PREVENTATIVE PHYSICAL DUE IN 1 YEAR. No follow-ups on file.

## 2023-11-07 NOTE — Assessment & Plan Note (Signed)
 Chronic.  Controlled with Meloxicam.  Patient is now off Suboxone so he can get his knee replaced.  Will check UDS at visit today and send to Dr. Odis Luster so he can get his surgery scheduled.  Handicap placard completed for patient during visit.

## 2023-11-08 LAB — COMPREHENSIVE METABOLIC PANEL
ALT: 36 IU/L (ref 0–44)
AST: 24 IU/L (ref 0–40)
Albumin: 4.5 g/dL (ref 3.9–4.9)
Alkaline Phosphatase: 87 IU/L (ref 44–121)
BUN/Creatinine Ratio: 13 (ref 10–24)
BUN: 15 mg/dL (ref 8–27)
Bilirubin Total: 0.4 mg/dL (ref 0.0–1.2)
CO2: 22 mmol/L (ref 20–29)
Calcium: 9.6 mg/dL (ref 8.6–10.2)
Chloride: 105 mmol/L (ref 96–106)
Creatinine, Ser: 1.12 mg/dL (ref 0.76–1.27)
Globulin, Total: 2.2 g/dL (ref 1.5–4.5)
Glucose: 98 mg/dL (ref 70–99)
Potassium: 4.2 mmol/L (ref 3.5–5.2)
Sodium: 141 mmol/L (ref 134–144)
Total Protein: 6.7 g/dL (ref 6.0–8.5)
eGFR: 72 mL/min/{1.73_m2} (ref >59)

## 2023-11-08 LAB — CBC WITH DIFFERENTIAL/PLATELET
Basophils Absolute: 0.1 10*3/uL (ref 0.0–0.2)
Basos: 1 %
EOS (ABSOLUTE): 0.5 10*3/uL — ABNORMAL HIGH (ref 0.0–0.4)
Eos: 6 %
Hematocrit: 45.3 % (ref 37.5–51.0)
Hemoglobin: 15.6 g/dL (ref 13.0–17.7)
Immature Grans (Abs): 0.1 10*3/uL (ref 0.0–0.1)
Immature Granulocytes: 1 %
Lymphocytes Absolute: 1.8 10*3/uL (ref 0.7–3.1)
Lymphs: 20 %
MCH: 32.8 pg (ref 26.6–33.0)
MCHC: 34.4 g/dL (ref 31.5–35.7)
MCV: 95 fL (ref 79–97)
Monocytes Absolute: 1 10*3/uL — ABNORMAL HIGH (ref 0.1–0.9)
Monocytes: 12 %
Neutrophils Absolute: 5.3 10*3/uL (ref 1.4–7.0)
Neutrophils: 60 %
Platelets: 269 10*3/uL (ref 150–450)
RBC: 4.76 x10E6/uL (ref 4.14–5.80)
RDW: 12.3 % (ref 11.6–15.4)
WBC: 8.8 10*3/uL (ref 3.4–10.8)

## 2023-11-08 LAB — DRUG SCREEN 764883 11+OXYCO+ALC+CRT-BUND
Amphetamines, Urine: NEGATIVE ng/mL
BENZODIAZ UR QL: NEGATIVE ng/mL
Barbiturate: NEGATIVE ng/mL
Cannabinoid Quant, Ur: NEGATIVE ng/mL
Cocaine (Metabolite): NEGATIVE ng/mL
Creatinine: 176.3 mg/dL (ref 20.0–300.0)
Ethanol: NEGATIVE %
Meperidine: NEGATIVE ng/mL
Methadone Screen, Urine: NEGATIVE ng/mL
OPIATE SCREEN URINE: NEGATIVE ng/mL
Oxycodone/Oxymorphone, Urine: NEGATIVE ng/mL
Phencyclidine: NEGATIVE ng/mL
Propoxyphene: NEGATIVE ng/mL
Tramadol: NEGATIVE ng/mL
pH, Urine: 5.3 (ref 4.5–8.9)

## 2023-11-08 LAB — LIPID PANEL
Chol/HDL Ratio: 4 ratio (ref 0.0–5.0)
Cholesterol, Total: 184 mg/dL (ref 100–199)
HDL: 46 mg/dL (ref >39)
LDL Chol Calc (NIH): 111 mg/dL — ABNORMAL HIGH (ref 0–99)
Triglycerides: 154 mg/dL — ABNORMAL HIGH (ref 0–149)
VLDL Cholesterol Cal: 27 mg/dL (ref 5–40)

## 2023-11-08 LAB — HEMOGLOBIN A1C
Est. average glucose Bld gHb Est-mCnc: 128 mg/dL
Hgb A1c MFr Bld: 6.1 % — ABNORMAL HIGH (ref 4.8–5.6)

## 2023-11-08 LAB — PSA: Prostate Specific Ag, Serum: 6.3 ng/mL — ABNORMAL HIGH (ref 0.0–4.0)

## 2023-11-08 LAB — TSH: TSH: 1.95 u[IU]/mL (ref 0.450–4.500)

## 2023-11-10 ENCOUNTER — Encounter: Payer: Self-pay | Admitting: Nurse Practitioner

## 2023-11-10 NOTE — Addendum Note (Signed)
 Addended by: Larae Grooms on: 11/10/2023 02:10 PM   Modules accepted: Orders

## 2023-11-12 ENCOUNTER — Telehealth: Payer: Self-pay | Admitting: Cardiology

## 2023-11-12 DIAGNOSIS — M170 Bilateral primary osteoarthritis of knee: Secondary | ICD-10-CM | POA: Diagnosis not present

## 2023-11-12 NOTE — Telephone Encounter (Signed)
   Name: Mark Sexton  DOB: 20-Aug-1956  MRN: [id number]  Primary Cardiologist: Debbe Odea, MD  Chart reviewed as part of pre-operative protocol coverage. Because of [name]'s past medical history and time since last visit, he will require a follow-up in-office visit in order to better assess preoperative cardiovascular risk.  Patient's last visit was 12/2022.  Pre-op covering staff: - Please schedule appointment and call patient to inform them. If patient already had an upcoming appointment within acceptable timeframe, please add "pre-op clearance" to the appointment notes so provider is aware. - Please contact requesting surgeon's office via preferred method (i.e, phone, fax) to inform them of need for appointment prior to surgery.   Napoleon Form, Leodis Rains, NP  11/12/2023, 11:04 AM

## 2023-11-12 NOTE — Telephone Encounter (Signed)
   Pre-operative Risk Assessment    Patient Name: Mark Sexton  DOB: Jan 14, 1956 MRN: [id number]   Date of last office visit: unknown Date of next office visit: unknown   Request for Surgical Clearance    Procedure:   lt Tka CorI  Date of Surgery:  Clearance 12/25/23                                Surgeon:  Dr Aggie Moats Group or Practice Name:  [facility] Phone number:  [phone] Fax number:  [phone]   Type of Clearance Requested:   - Medical      Type of Anesthesia:  Spinal   Additional requests/questions:    SignedShawna Orleans   11/12/2023, 10:51 AM

## 2023-11-12 NOTE — Telephone Encounter (Signed)
 Tried calling patient to schedule in person office visit no answer left a detailed message to call back and schedule

## 2023-11-13 NOTE — Telephone Encounter (Signed)
 Pt scheduled to see Dr. Azucena Cecil, 11/14/23.  Clearance will be addressed at that time.  Will send back to the requesting surgeon's office to make them aware.

## 2023-11-14 ENCOUNTER — Encounter: Payer: Self-pay | Admitting: Cardiology

## 2023-11-14 ENCOUNTER — Ambulatory Visit: Attending: Cardiology | Admitting: Cardiology

## 2023-11-14 VITALS — BP 130/86 | HR 94 | Ht 72.0 in | Wt 270.4 lb

## 2023-11-14 DIAGNOSIS — E782 Mixed hyperlipidemia: Secondary | ICD-10-CM | POA: Diagnosis not present

## 2023-11-14 DIAGNOSIS — Z01810 Encounter for preprocedural cardiovascular examination: Secondary | ICD-10-CM

## 2023-11-14 DIAGNOSIS — I2510 Atherosclerotic heart disease of native coronary artery without angina pectoris: Secondary | ICD-10-CM

## 2023-11-14 MED ORDER — ASPIRIN 81 MG PO TBEC: 81.0000 mg | DELAYED_RELEASE_TABLET | Freq: Every day | ORAL | Status: AC

## 2023-11-14 MED ORDER — ATORVASTATIN CALCIUM 20 MG PO TABS
20.0000 mg | ORAL_TABLET | Freq: Every day | ORAL | 3 refills | Status: AC
Start: 2023-11-14 — End: 2024-07-13

## 2023-11-14 NOTE — Progress Notes (Signed)
 Cardiology Office Note:    Date:  11/14/2023   ID:  MARKEEM NOREEN, DOB March 15, 1956, MRN [id number]  PCP:  Larae Grooms, NP   [facility] Providers Cardiologist:  Debbe Odea, MD     Referring MD: Larae Grooms, NP   Chief Complaint  Patient presents with   Preoperative clearance    Preoperative clearance for knee replacement surgery. Patient is doing ok on today. Meds reviewed.     History of Present Illness:    Mark Sexton is a 68 y.o. male with a hx of CAD (RCA calcifications on chest CT 2/24),  osteoarthritis, former smoker x 30+ years, emphysema presenting for follow-up.  Previously seen due to coronary calcifications on chest CT and preop evaluation.  Workup with echocardiogram and Lexiscan Myoview were unrevealing.  Surgery was recommended, apparently patient took a pain medication from a friend which was not prescribed.  His surgical procedure was canceled.  He otherwise states feeling okay, denies chest pain or shortness of breath.  Endorse having bilateral knee pain worse on left.   Prior notes/testing Echo 12/2022 EF 55 to 60% Lexiscan Myoview 12/2022 no significant ischemia, low risk study. Chest CT lung cancer screening 10/2022 showed RCA calcification.  Emphysema also noted  Past Medical History:  Diagnosis Date   Allergy    Arthritis     Past Surgical History:  Procedure Laterality Date   COLONOSCOPY WITH PROPOFOL N/A 06/23/2023   Procedure: COLONOSCOPY WITH PROPOFOL;  Surgeon: Wyline Mood, MD;  Location: [facility] ENDOSCOPY;  Service: Gastroenterology;  Laterality: N/A;   HEMOSTASIS CLIP PLACEMENT  06/23/2023   Procedure: HEMOSTASIS CLIP PLACEMENT;  Surgeon: Wyline Mood, MD;  Location: [facility] ENDOSCOPY;  Service: Gastroenterology;;   POLYPECTOMY  06/23/2023   Procedure: POLYPECTOMY;  Surgeon: Wyline Mood, MD;  Location: [facility] ENDOSCOPY;  Service: Gastroenterology;;   Sunnie Nielsen LIFTING INJECTION  06/23/2023   Procedure: SUBMUCOSAL LIFTING  INJECTION;  Surgeon: Wyline Mood, MD;  Location: [facility] ENDOSCOPY;  Service: Gastroenterology;;    Current Medications: Current Meds  Medication Sig   aspirin EC 81 MG tablet Take 1 tablet (81 mg total) by mouth daily. Swallow whole.   atorvastatin (LIPITOR) 20 MG tablet Take 1 tablet (20 mg total) by mouth daily.   meloxicam (MOBIC) 15 MG tablet Take 1 tablet (15 mg total) by mouth daily.   traZODone (DESYREL) 100 MG tablet Take 2 tablets (200 mg total) by mouth at bedtime as needed for sleep.     Allergies:   Penicillins   Social History   Socioeconomic History   Marital status: Divorced    Spouse name: Not on file   Number of children: 2   Years of education: Not on file   Highest education level: Not on file  Occupational History   Occupation: retired Music therapist  Tobacco Use   Smoking status: Former    Current packs/day: 0.00    Average packs/day: 0.6 packs/day for 38.0 years (22.8 ttl pk-yrs)    Types: Cigarettes    Start date: 11/26/1981    Quit date: 11/27/2019    Years since quitting: 3.9   Smokeless tobacco: Never  Vaping Use   Vaping status: Former   Quit date: 05/05/2020   Substances: Nicotine, Flavoring  Substance and Sexual Activity   Alcohol use: Yes    Alcohol/week: 42.0 standard drinks of alcohol    Types: 42 Cans of beer per week    Comment: 6 pack of beer daily   Drug use: Not Currently  Types: Marijuana    Comment: last use ~2005   Sexual activity: Yes  Other Topics Concern   Not on file  Social History Narrative   Has lived with girlfriend for 20 years, 2 children from previous relationships   Social Drivers of Corporate investment banker Strain: Low Risk  (05/06/2023)   Overall Financial Resource Strain (CARDIA)    Difficulty of Paying Living Expenses: Not hard at all  Food Insecurity: No Food Insecurity (05/06/2023)   Hunger Vital Sign    Worried About Running Out of Food in the Last Year: Never true    Ran Out of Food in the Last Year: Never  true  Transportation Needs: No Transportation Needs (05/06/2023)   PRAPARE - Administrator, Civil Service (Medical): No    Lack of Transportation (Non-Medical): No  Physical Activity: Insufficiently Active (05/06/2023)   Exercise Vital Sign    Days of Exercise per Week: 4 days    Minutes of Exercise per Session: 20 min  Stress: No Stress Concern Present (05/06/2023)   [employer] of Occupational Health - Occupational Stress Questionnaire    Feeling of Stress : Only a little  Social Connections: Socially Isolated (05/06/2023)   Social Connection and Isolation Panel [NHANES]    Frequency of Communication with Friends and Family: More than three times a week    Frequency of Social Gatherings with Friends and Family: Three times a week    Attends Religious Services: Never    Active Member of Clubs or Organizations: No    Attends Banker Meetings: Never    Marital Status: Divorced     Family History: The patient's family history includes Birth defects in his maternal grandmother; COPD in his father; Cancer in his mother.  ROS:   Please see the history of present illness.     All other systems reviewed and are negative.  EKGs/Labs/Other Studies Reviewed:    The following studies were reviewed today:   EKG Interpretation Date/Time:  Friday November 14 2023 14:12:38 EST Ventricular Rate:  94 PR Interval:  182 QRS Duration:  98 QT Interval:  350 QTC Calculation: 437 R Axis:   -4  Text Interpretation: Sinus rhythm with occasional Premature ventricular complexes Confirmed by Debbe Odea ([id number]) on 11/14/2023 2:36:31 PM    Recent Labs: 11/07/2023: ALT 36; BUN 15; Creatinine, Ser 1.12; Hemoglobin 15.6; Platelets 269; Potassium 4.2; Sodium 141; TSH 1.950  Recent Lipid Panel    Component Value Date/Time   CHOL 184 11/07/2023 1003   TRIG 154 (H) 11/07/2023 1003   HDL 46 11/07/2023 1003   CHOLHDL 4.0 11/07/2023 1003   LDLCALC 111 (H) 11/07/2023 1003      Risk Assessment/Calculations:             Physical Exam:    VS:  BP 130/86   Pulse 94   Ht 6' (1.829 m)   Wt 270 lb 6.4 oz (122.7 kg)   SpO2 95%   BMI 36.67 kg/m     Wt Readings from Last 3 Encounters:  11/14/23 270 lb 6.4 oz (122.7 kg)  11/07/23 275 lb 3.2 oz (124.8 kg)  08/20/23 275 lb 12.8 oz (125.1 kg)     GEN:  Well nourished, well developed in no acute distress HEENT: Normal NECK: No JVD; No carotid bruits CARDIAC: RRR, no murmurs, rubs, gallops RESPIRATORY:  Clear to auscultation without rales, wheezing or rhonchi  ABDOMEN: Soft, non-tender, non-distended MUSCULOSKELETAL:  No edema; No deformity  SKIN: Warm and dry NEUROLOGIC:  Alert and oriented x 3 PSYCHIATRIC:  Normal affect   ASSESSMENT:    1. Coronary artery disease involving native coronary artery of native heart, unspecified whether angina present   2. Pre-procedural cardiovascular examination   3. Mixed hyperlipidemia    PLAN:    In order of problems listed above:  CAD/RCA calcifications on chest CT.  Echo and Lexiscan Myoview 12/2022 normal EF, no significant ischemia.  Start aspirin 81 mg daily, Lipitor 20 mg daily. Preprocedural exam, left knee surgery being planned.  Okay for surgical procedure from a cardiac perspective.  Stress test showed no ischemia, echo with normal EF. Hyperlipidemia, start Lipitor 20 mg daily.     Follow-up in 1 year.  Medication Adjustments/Labs and Tests Ordered: Current medicines are reviewed at length with the patient today.  Concerns regarding medicines are outlined above.  Orders Placed This Encounter  Procedures   EKG 12-Lead   Meds ordered this encounter  Medications   aspirin EC 81 MG tablet    Sig: Take 1 tablet (81 mg total) by mouth daily. Swallow whole.   atorvastatin (LIPITOR) 20 MG tablet    Sig: Take 1 tablet (20 mg total) by mouth daily.    Dispense:  90 tablet    Refill:  3    Patient Instructions  Medication Instructions:  Your  physician recommends the following medication changes.  START TAKING: Aspirin 81 mg daily Lipitor 20 mg daily  *If you need a refill on your cardiac medications before your next appointment, please call your pharmacy*   Lab Work: None ordered at this time    Follow-Up: At [facility], you and your health needs are our priority.  As part of our continuing mission to provide you with exceptional heart care, we have created designated Provider Care Teams.  These Care Teams include your primary Cardiologist (physician) and Advanced Practice Providers (APPs -  Physician Assistants and Nurse Practitioners) who all work together to provide you with the care you need, when you need it.  Your next appointment:   1 year(s)  Provider:   You may see Debbe Odea, MD or one of the following Advanced Practice Providers on your designated Care Team:   Nicolasa Ducking, NP Eula Listen, PA-C Cadence Fransico Michael, PA-C Charlsie Quest, NP Carlos Levering, NP    Signed, Debbe Odea, MD  11/14/2023 3:27 PM    [facility]

## 2023-11-14 NOTE — Patient Instructions (Signed)
 Medication Instructions:  Your physician recommends the following medication changes.  START TAKING: Aspirin 81 mg daily Lipitor 20 mg daily  *If you need a refill on your cardiac medications before your next appointment, please call your pharmacy*   Lab Work: None ordered at this time    Follow-Up: At [facility], you and your health needs are our priority.  As part of our continuing mission to provide you with exceptional heart care, we have created designated Provider Care Teams.  These Care Teams include your primary Cardiologist (physician) and Advanced Practice Providers (APPs -  Physician Assistants and Nurse Practitioners) who all work together to provide you with the care you need, when you need it.  Your next appointment:   1 year(s)  Provider:   You may see Debbe Odea, MD or one of the following Advanced Practice Providers on your designated Care Team:   Nicolasa Ducking, NP Eula Listen, PA-C Cadence Fransico Michael, PA-C Charlsie Quest, NP Carlos Levering, NP

## 2023-11-27 ENCOUNTER — Other Ambulatory Visit: Payer: Self-pay

## 2023-11-27 DIAGNOSIS — Z122 Encounter for screening for malignant neoplasm of respiratory organs: Secondary | ICD-10-CM

## 2023-11-27 DIAGNOSIS — Z87891 Personal history of nicotine dependence: Secondary | ICD-10-CM

## 2023-12-02 ENCOUNTER — Ambulatory Visit (INDEPENDENT_AMBULATORY_CARE_PROVIDER_SITE_OTHER): Admitting: Urology

## 2023-12-02 ENCOUNTER — Encounter: Payer: Self-pay | Admitting: Urology

## 2023-12-02 VITALS — Ht 72.0 in | Wt 270.0 lb

## 2023-12-02 DIAGNOSIS — R972 Elevated prostate specific antigen [PSA]: Secondary | ICD-10-CM | POA: Diagnosis not present

## 2023-12-02 NOTE — Patient Instructions (Addendum)
 We will repeat a PSA blood test and call you with those results.  If PSA remains concerning neck step would be a prostate MRI.  If prostate MRI shows any abnormal findings this would require biopsy.  Prostate Cancer Screening  Prostate cancer screening is testing that is done to check for the presence of prostate cancer in men. The prostate gland is a walnut-sized gland that is located below the bladder and in front of the rectum in males. The function of the prostate is to add fluid to semen during ejaculation. Prostate cancer is one of the most common types of cancer in men. Who should have prostate cancer screening? Screening recommendations vary based on age and other risk factors, as well as between the professional organizations who make the recommendations. In general, screening is recommended if: You are age 29 to 82 and have an average risk for prostate cancer. You should talk with your health care provider about your need for screening and how often screening should be done. Because most prostate cancers are slow growing and will not cause death, screening in this age group is generally reserved for men who have a 10- to 15-year life expectancy. You are younger than age 56, and you have these risk factors: Having a father, brother, or uncle who has been diagnosed with prostate cancer. The risk is higher if your family member's cancer occurred at an early age or if you have multiple family members with prostate cancer at an early age. Being a male who is [place] or is of [place] or sub-Saharan African descent. In general, screening is not recommended if: You are younger than age 18. You are between the ages of 49 and 46 and you have no risk factors. You are 18 years of age or older. At this age, the risks that screening can cause are greater than the benefits that it may provide. If you are at high risk for prostate cancer, your health care provider may recommend that you have screenings  more often or that you start screening at a younger age. How is screening for prostate cancer done? The recommended prostate cancer screening test is a blood test called the prostate-specific antigen (PSA) test. PSA is a protein that is made in the prostate. As you age, your prostate naturally produces more PSA. Abnormally high PSA levels may be caused by: Prostate cancer. An enlarged prostate that is not caused by cancer (benign prostatic hyperplasia, or BPH). This condition is very common in older men. A prostate gland infection (prostatitis) or urinary tract infection. Certain medicines such as male hormones (like testosterone) or other medicines that raise testosterone levels. A rectal exam may be done as part of prostate cancer screening to help provide information about the size of your prostate gland. When a rectal exam is performed, it should be done after the PSA level is drawn to avoid any effect on the results. Depending on the PSA results, you may need more tests, such as: A physical exam to check the size of your prostate gland, if not done as part of screening. Blood and imaging tests. A procedure to remove tissue samples from your prostate gland for testing (biopsy). This is the only way to know for certain if you have prostate cancer. What are the benefits of prostate cancer screening? Screening can help to identify cancer at an early stage, before symptoms start and when the cancer can be treated more easily. There is a small chance that screening may lower  your risk of dying from prostate cancer. The chance is small because prostate cancer is a slow-growing cancer, and most men with prostate cancer die from a different cause. What are the risks of prostate cancer screening? The main risk of prostate cancer screening is diagnosing and treating prostate cancer that would never have caused any symptoms or problems. This is called overdiagnosisand overtreatment. PSA screening cannot  tell you if your PSA is high due to cancer or a different cause. A prostate biopsy is the only procedure to diagnose prostate cancer. Even the results of a biopsy may not tell you if your cancer needs to be treated. Slow-growing prostate cancer may not need any treatment other than monitoring, so diagnosing and treating it may cause unnecessary stress or other side effects. Questions to ask your health care provider When should I start prostate cancer screening? What is my risk for prostate cancer? How often do I need screening? What type of screening tests do I need? How do I get my test results? What do my results mean? Do I need treatment? Where to find more information The American Cancer Society: www.cancer.org American Urological Association: www.auanet.org Contact a health care provider if: You have difficulty urinating. You have pain when you urinate or ejaculate. You have blood in your urine or semen. You have pain in your back or in the area of your prostate. Summary Prostate cancer is a common type of cancer in men. The prostate gland is located below the bladder and in front of the rectum. This gland adds fluid to semen during ejaculation. Prostate cancer screening may identify cancer at an early stage, when the cancer can be treated more easily and is less likely to have spread to other areas of the body. The prostate-specific antigen (PSA) test is the recommended screening test for prostate cancer, but it has associated risks. Discuss the risks and benefits of prostate cancer screening with your health care provider. If you are age 23 or older, the risks that screening can cause are greater than the benefits that it may provide. This information is not intended to replace advice given to you by your health care provider. Make sure you discuss any questions you have with your health care provider. Document Revised: 02/19/2021 Document Reviewed: 02/19/2021 Elsevier Patient  Education  2024 [employer].

## 2023-12-02 NOTE — Progress Notes (Signed)
   12/02/2023 2:06 PM   Mark Sexton 1956/02/06 [id number]  Reason for visit: Elevated PSA  HPI: 67 year old male who I previously saw in 2023 for asymptomatic microscopic hematuria, CT urogram was benign, he refused cystoscopy.  Most recent urinalysis was benign with no microscopic hematuria.  He was rereferred for new elevated PSA of 6.3 on 11/07/2023.  This had increased from 4.1 in October 2023, and 3.0 in January 2023.  He denies any urinary symptoms.I personally viewed and interpreted the CT urogram from March 2023 showing no urologic abnormalities, prostate measured 42 g.  PSA density 0.15  We reviewed the implications of an elevated PSA and the uncertainty surrounding it. In general, a man's PSA increases with age and is produced by both normal and cancerous prostate tissue. The differential diagnosis for elevated PSA includes BPH, prostate cancer, infection/prostatitis, recent intercourse/ejaculation, recent urethroscopic manipulation (foley placement/cystoscopy) or trauma. Management of an elevated PSA can include observation/surveillance, prostate MRI, or prostate biopsy and we discussed this in detail. Our goal is to detect clinically significant prostate cancers, and manage with either active surveillance, surgery, or radiation for localized disease. Risks of prostate biopsy include bleeding, infection (including life threatening sepsis), pain, and lower urinary symptoms. Hematuria, hematospermia, and blood in the stool are all common after biopsy and can persist up to 4 weeks.  Using shared decision making he opted for a PHI score, understands need for prostate MRI if concerning, and potentially fusion guided biopsy if abnormal prostate MRI   Sondra Come, MD  [facility] [address] [place], [place] [address] [phone]

## 2023-12-09 HISTORY — PX: REPLACEMENT TOTAL KNEE: SUR1224

## 2023-12-15 ENCOUNTER — Other Ambulatory Visit

## 2023-12-25 DIAGNOSIS — Z7982 Long term (current) use of aspirin: Secondary | ICD-10-CM | POA: Diagnosis not present

## 2023-12-25 DIAGNOSIS — M1712 Unilateral primary osteoarthritis, left knee: Secondary | ICD-10-CM | POA: Diagnosis not present

## 2023-12-25 DIAGNOSIS — G8918 Other acute postprocedural pain: Secondary | ICD-10-CM | POA: Diagnosis not present

## 2023-12-25 DIAGNOSIS — M25762 Osteophyte, left knee: Secondary | ICD-10-CM | POA: Diagnosis not present

## 2023-12-25 DIAGNOSIS — E785 Hyperlipidemia, unspecified: Secondary | ICD-10-CM | POA: Diagnosis not present

## 2023-12-26 DIAGNOSIS — Z7982 Long term (current) use of aspirin: Secondary | ICD-10-CM | POA: Diagnosis not present

## 2023-12-26 DIAGNOSIS — M25762 Osteophyte, left knee: Secondary | ICD-10-CM | POA: Diagnosis not present

## 2023-12-26 DIAGNOSIS — M1712 Unilateral primary osteoarthritis, left knee: Secondary | ICD-10-CM | POA: Diagnosis not present

## 2023-12-26 DIAGNOSIS — E785 Hyperlipidemia, unspecified: Secondary | ICD-10-CM | POA: Diagnosis not present

## 2024-01-01 DIAGNOSIS — Z96652 Presence of left artificial knee joint: Secondary | ICD-10-CM | POA: Diagnosis not present

## 2024-01-06 DIAGNOSIS — Z96652 Presence of left artificial knee joint: Secondary | ICD-10-CM | POA: Diagnosis not present

## 2024-01-08 DIAGNOSIS — Z96652 Presence of left artificial knee joint: Secondary | ICD-10-CM | POA: Diagnosis not present

## 2024-01-08 DIAGNOSIS — M1711 Unilateral primary osteoarthritis, right knee: Secondary | ICD-10-CM | POA: Diagnosis not present

## 2024-01-15 DIAGNOSIS — Z96652 Presence of left artificial knee joint: Secondary | ICD-10-CM | POA: Diagnosis not present

## 2024-01-20 DIAGNOSIS — Z96652 Presence of left artificial knee joint: Secondary | ICD-10-CM | POA: Diagnosis not present

## 2024-04-09 HISTORY — PX: REPLACEMENT TOTAL KNEE: SUR1224

## 2024-04-16 ENCOUNTER — Telehealth: Payer: Self-pay | Admitting: Cardiology

## 2024-04-16 DIAGNOSIS — M1711 Unilateral primary osteoarthritis, right knee: Secondary | ICD-10-CM | POA: Diagnosis not present

## 2024-04-16 NOTE — Telephone Encounter (Signed)
   Name: Mark Sexton  DOB: July 23, 1956  MRN: [id number]  Primary Cardiologist: Mark Cave, MD   Preoperative team, please contact this patient and set up a phone call appointment for further preoperative risk assessment. Please obtain consent and complete medication review. Thank you for your help.  I confirm that guidance regarding antiplatelet and oral anticoagulation therapy has been completed and, if necessary, noted below.  Regarding ASA therapy, we recommend continuation of ASA throughout the perioperative period. However, if the surgeon feels that cessation of ASA is required in the perioperative period, it may be stopped 5-7 days prior to surgery with a plan to resume it as soon as felt to be feasible from a surgical standpoint in the post-operative period.   I also confirmed the patient resides in the state of [place] . As per [facility] Medical Board telemedicine laws, the patient must reside in the state in which the provider is licensed.   Mark Sexton E Mark Biederman, NP 04/16/2024, 3:42 PM [facility]

## 2024-04-16 NOTE — Telephone Encounter (Signed)
 1st attempt : Called patient, NA, left message to contact pre-op to schedule telehealth appointment for pre-op clearance.

## 2024-04-16 NOTE — Telephone Encounter (Signed)
   Pre-operative Risk Assessment    Patient Name: Mark Sexton  DOB: [birth year] MRN: [id number]   Date of last office visit: unknown Date of next office visit: unknown   Request for Surgical Clearance    Procedure:  Rt TKA  cop  Date of Surgery:  Clearance 05/06/24                                Surgeon:  Dr. Mar Surgeon's Group or Practice Name:  [facility] Phone number:  [phone] Fax number:  [phone]   Type of Clearance Requested:   - Pharmacy:  Hold Clopidogrel (Plavix) and asprin discontinue per instructions   Type of Anesthesia:  Local    Additional requests/questions:    Mark Sexton   04/16/2024, 2:51 PM

## 2024-04-19 ENCOUNTER — Telehealth: Payer: Self-pay

## 2024-04-19 ENCOUNTER — Telehealth: Payer: Self-pay | Admitting: Cardiology

## 2024-04-19 NOTE — Telephone Encounter (Signed)
 2nd attempt: Left message for the pt to call our office and ask for the preop team to schedule TELE Preop appt.

## 2024-04-19 NOTE — Telephone Encounter (Signed)
  Patient Consent for Virtual Visit        Mark Sexton has provided verbal consent on 04/19/2024 for a virtual visit (video or telephone).   CONSENT FOR VIRTUAL VISIT FOR:  Mark Sexton  By participating in this virtual visit I agree to the following:  I hereby voluntarily request, consent and authorize [facility] and its employed or contracted physicians, physician assistants, nurse practitioners or other licensed health care professionals (the Practitioner), to provide me with telemedicine health care services (the "Services) as deemed necessary by the treating Practitioner. I acknowledge and consent to receive the Services by the Practitioner via telemedicine. I understand that the telemedicine visit will involve communicating with the Practitioner through live audiovisual communication technology and the disclosure of certain medical information by electronic transmission. I acknowledge that I have been given the opportunity to request an in-person assessment or other available alternative prior to the telemedicine visit and am voluntarily participating in the telemedicine visit.  I understand that I have the right to withhold or withdraw my consent to the use of telemedicine in the course of my care at any time, without affecting my right to future care or treatment, and that the Practitioner or I may terminate the telemedicine visit at any time. I understand that I have the right to inspect all information obtained and/or recorded in the course of the telemedicine visit and may receive copies of available information for a reasonable fee.  I understand that some of the potential risks of receiving the Services via telemedicine include:  Delay or interruption in medical evaluation due to technological equipment failure or disruption; Information transmitted may not be sufficient (e.g. poor resolution of images) to allow for appropriate medical decision making by the Practitioner;  and/or  In rare instances, security protocols could fail, causing a breach of personal health information.  Furthermore, I acknowledge that it is my responsibility to provide information about my medical history, conditions and care that is complete and accurate to the best of my ability. I acknowledge that Practitioner's advice, recommendations, and/or decision may be based on factors not within their control, such as incomplete or inaccurate data provided by me or distortions of diagnostic images or specimens that may result from electronic transmissions. I understand that the practice of medicine is not an exact science and that Practitioner makes no warranties or guarantees regarding treatment outcomes. I acknowledge that a copy of this consent can be made available to me via my patient portal [facility] MyChart), or I can request a printed copy by calling the office of [facility].    I understand that my insurance will be billed for this visit.   I have read or had this consent read to me. I understand the contents of this consent, which adequately explains the benefits and risks of the Services being provided via telemedicine.  I have been provided ample opportunity to ask questions regarding this consent and the Services and have had my questions answered to my satisfaction. I give my informed consent for the services to be provided through the use of telemedicine in my medical care

## 2024-04-19 NOTE — Telephone Encounter (Signed)
 Patient scheduled for pre-op phone call on 04/22/24 with Damien Braver, NP

## 2024-04-19 NOTE — Telephone Encounter (Signed)
   Pre-operative Risk Assessment    Patient Name: Mark Sexton  DOB: 1956-09-09 MRN: [id number]   Date of last office visit: 11-14-23 Date of next office visit: unknown   Request for Surgical Clearance    Procedure:  RTKA  Date of Surgery:  Clearance 05/06/24                                Surgeon:  Dr. Leora Surgeon's Group or Practice Name:  [facility] Phone number:  [phone] Fax number:  [phone]   Type of Clearance Requested:   - Pharmacy:  Hold Clopidogrel (Plavix) need hold request for surgery   Type of Anesthesia:  Not Indicated   Additional requests/questions:    [name]   04/19/2024, 8:05 AM

## 2024-04-22 ENCOUNTER — Ambulatory Visit: Attending: Internal Medicine | Admitting: Nurse Practitioner

## 2024-04-22 ENCOUNTER — Telehealth: Payer: Self-pay | Admitting: Nurse Practitioner

## 2024-04-22 ENCOUNTER — Telehealth: Payer: Self-pay | Admitting: Cardiology

## 2024-04-22 DIAGNOSIS — Z01810 Encounter for preprocedural cardiovascular examination: Secondary | ICD-10-CM | POA: Diagnosis not present

## 2024-04-22 NOTE — Telephone Encounter (Signed)
 Tequila, Your right needs office visit reason: surgical clearance and labs will be determined by PCP at the time of that visit.   Thank you!

## 2024-04-22 NOTE — Telephone Encounter (Signed)
 He would need a surgical clearance OV right or just labs?

## 2024-04-22 NOTE — Telephone Encounter (Signed)
 Patient does not want to schedule a visit. States that he will just go get labs somewhere else.

## 2024-04-22 NOTE — Telephone Encounter (Signed)
 Spoke with Katheryn calling about preop clearance. She just wanted to confirm that patient is not on any other anticoagulation. Confirmed that he is only taking Asprin. She was appreciative with no further questions.

## 2024-04-22 NOTE — Progress Notes (Signed)
 Virtual Visit via Telephone Note   Because of Mark Sexton co-morbid illnesses, he is at least at moderate risk for complications without adequate follow up.  This format is felt to be most appropriate for this patient at this time.  Due to technical limitations with video connection (technology), today's appointment will be conducted as an audio only telehealth visit, and Mark Sexton verbally agreed to proceed in this manner.   All issues noted in this document were discussed and addressed.  No physical exam could be performed with this format.  Evaluation Performed:  Preoperative cardiovascular risk assessment _____________   Date:  04/22/2024   Patient ID:  Mark Sexton, DOB 1956-06-24, MRN [id number] Patient Location:  Home Provider location:   Office  Primary Care Provider:  Melvin Pao, NP Primary Cardiologist:  Redell Cave, MD  Chief Complaint / Patient Profile   68 y.o. y/o male with a h/o nonobstructive CAD, hyperlipidemia, and arthritis who is pending right total knee arthroplasty on 05/06/2024 with Dr. Leora of [facility] and presents today for telephonic preoperative cardiovascular risk assessment.  History of Present Illness    Mark Sexton is a 68 y.o. male who presents via audio/video conferencing for a telehealth visit today.  Pt was last seen in cardiology clinic on 11/14/2023 by Dr. Cave. At that time Mark Sexton was doing well.  The patient is now pending procedure as outlined above. Since his last visit, he has done well from a cardiac standpoint.  His activity is somewhat limited in the setting of knee pain.  He denies chest pain, palpitations, dyspnea, pnd, orthopnea, n, v, dizziness, syncope, edema, weight gain, or early satiety. All other systems reviewed and are otherwise negative except as noted above.   Past Medical History    Past Medical History:  Diagnosis Date   Allergy    Arthritis    Past Surgical History:  Procedure Laterality  Date   COLONOSCOPY WITH PROPOFOL  N/A 06/23/2023   Procedure: COLONOSCOPY WITH PROPOFOL ;  Surgeon: Therisa Bi, MD;  Location: [facility] ENDOSCOPY;  Service: Gastroenterology;  Laterality: N/A;   HEMOSTASIS CLIP PLACEMENT  06/23/2023   Procedure: HEMOSTASIS CLIP PLACEMENT;  Surgeon: Therisa Bi, MD;  Location: [facility] ENDOSCOPY;  Service: Gastroenterology;;   POLYPECTOMY  06/23/2023   Procedure: POLYPECTOMY;  Surgeon: Therisa Bi, MD;  Location: [facility] ENDOSCOPY;  Service: Gastroenterology;;   ROBLEY LIFTING INJECTION  06/23/2023   Procedure: SUBMUCOSAL LIFTING INJECTION;  Surgeon: Therisa Bi, MD;  Location: [facility] ENDOSCOPY;  Service: Gastroenterology;;    Allergies  Allergies  Allergen Reactions   Penicillins Rash    Home Medications    Prior to Admission medications   Medication Sig Start Date End Date Taking? Authorizing Provider  aspirin  EC 81 MG tablet Take 1 tablet (81 mg total) by mouth daily. Swallow whole. 11/14/23   Cave Redell, MD  atorvastatin  (LIPITOR) 20 MG tablet Take 1 tablet (20 mg total) by mouth daily. 11/14/23 04/19/24  Cave Redell, MD  meloxicam  (MOBIC ) 15 MG tablet Take 1 tablet (15 mg total) by mouth daily. 11/07/23   Melvin Pao, NP  traZODone  (DESYREL ) 100 MG tablet Take 2 tablets (200 mg total) by mouth at bedtime as needed for sleep. 11/07/23   Melvin Pao, NP    Physical Exam    Vital Signs:  Mark Sexton does not have vital signs available for review today.  Given telephonic nature of communication, physical exam is limited. AAOx3. NAD. Normal affect.  Speech and respirations  are unlabored.  Accessory Clinical Findings    None  Assessment & Plan    1.  Preoperative Cardiovascular Risk Assessment:  According to the Revised Cardiac Risk Index (RCRI), his Perioperative Risk of Major Cardiac Event is (%): 0.4. His Functional Capacity in METs is: 6.45 according to the Duke Activity Status Index (DASI).Therefore, based on ACC/AHA  guidelines, patient would be at acceptable risk for the planned procedure without further cardiovascular testing.   The patient was advised that if he develops new symptoms prior to surgery to contact our office to arrange for a follow-up visit, and he verbalized understanding.  Per office protocol, he may hold Aspirin  for 5-7 days prior to procedure. Please resume Aspirin  as soon as possible postprocedure, at the discretion of the surgeon.    A copy of this note will be routed to requesting surgeon.  Time:   Today, I have spent 5 minutes with the patient with telehealth technology discussing medical history, symptoms, and management plan.     Damien JAYSON Braver, NP  04/22/2024, 2:53 PM

## 2024-04-22 NOTE — Telephone Encounter (Signed)
 Office calling to f/u on fax that was sent on 8/8 as well as if pt is currently on Plavix. If so does it need to be held for upcoming surgery. Please advise

## 2024-04-22 NOTE — Telephone Encounter (Signed)
 Copied from CRM [id number]. Topic: Clinical - Request for Lab/Test Order >> Apr 22, 2024 12:41 PM DeAngela L wrote: Reason for CRM: patient calling to get scheduled for a lab appt for his knee surgical clearance   Pt can come this afternoon or tomorrow anytime   Pt num [phone] (M)

## 2024-04-23 DIAGNOSIS — Z0189 Encounter for other specified special examinations: Secondary | ICD-10-CM | POA: Diagnosis not present

## 2024-04-23 DIAGNOSIS — Z01818 Encounter for other preprocedural examination: Secondary | ICD-10-CM | POA: Diagnosis not present

## 2024-05-05 ENCOUNTER — Other Ambulatory Visit: Payer: Self-pay | Admitting: Nurse Practitioner

## 2024-05-06 DIAGNOSIS — G8918 Other acute postprocedural pain: Secondary | ICD-10-CM | POA: Diagnosis not present

## 2024-05-06 DIAGNOSIS — M25761 Osteophyte, right knee: Secondary | ICD-10-CM | POA: Diagnosis not present

## 2024-05-06 DIAGNOSIS — E785 Hyperlipidemia, unspecified: Secondary | ICD-10-CM | POA: Diagnosis not present

## 2024-05-06 DIAGNOSIS — M21861 Other specified acquired deformities of right lower leg: Secondary | ICD-10-CM | POA: Diagnosis not present

## 2024-05-06 DIAGNOSIS — Z7982 Long term (current) use of aspirin: Secondary | ICD-10-CM | POA: Diagnosis not present

## 2024-05-06 DIAGNOSIS — Z96652 Presence of left artificial knee joint: Secondary | ICD-10-CM | POA: Diagnosis not present

## 2024-05-06 DIAGNOSIS — M1711 Unilateral primary osteoarthritis, right knee: Secondary | ICD-10-CM | POA: Diagnosis not present

## 2024-05-06 NOTE — Telephone Encounter (Signed)
 Requested Prescriptions  Pending Prescriptions Disp Refills   traZODone  (DESYREL ) 100 MG tablet [Pharmacy Med Name: traZODone  HCl 100 MG Oral Tablet] 180 tablet 0    Sig: TAKE 2 TABLETS BY MOUTH AT BEDTIME AS NEEDED FOR SLEEP     Psychiatry: Antidepressants - Serotonin Modulator Failed - 05/06/2024  3:01 PM      Failed - Valid encounter within last 6 months    Recent Outpatient Visits           6 months ago Annual physical exam   [facility] Melvin Pao, NP       Future Appointments             In 1 week Melvin Pao, NP [facility], [address]             meloxicam  (MOBIC ) 15 MG tablet [Pharmacy Med Name: Meloxicam  15 MG Oral Tablet] 90 tablet 0    Sig: Take 1 tablet by mouth once daily     Analgesics:  COX2 Inhibitors Failed - 05/06/2024  3:01 PM      Failed - Manual Review: Labs are only required if the patient has taken medication for more than 8 weeks.      Passed - HGB in normal range and within 360 days    Hemoglobin  Date Value Ref Range Status  11/07/2023 15.6 13.0 - 17.7 g/dL Final         Passed - Cr in normal range and within 360 days    Creatinine  Date Value Ref Range Status  11/07/2023 176.3 20.0 - 300.0 mg/dL Final   Creatinine, Ser  Date Value Ref Range Status  11/07/2023 1.12 0.76 - 1.27 mg/dL Final         Passed - HCT in normal range and within 360 days    Hematocrit  Date Value Ref Range Status  11/07/2023 45.3 37.5 - 51.0 % Final         Passed - AST in normal range and within 360 days    AST  Date Value Ref Range Status  11/07/2023 24 0 - 40 IU/L Final         Passed - ALT in normal range and within 360 days    ALT  Date Value Ref Range Status  11/07/2023 36 0 - 44 IU/L Final         Passed - eGFR is 30 or above and within 360 days    GFR, Estimated  Date Value Ref Range Status  09/25/2020 >60 >60 mL/min Final    Comment:    (NOTE) Calculated using the CKD-EPI  Creatinine Equation (2021)    eGFR  Date Value Ref Range Status  11/07/2023 72 >59 mL/min/1.73 Final         Passed - Patient is not pregnant      Passed - Valid encounter within last 12 months    Recent Outpatient Visits           6 months ago Annual physical exam   [place] [facility] Melvin Pao, NP       Future Appointments             In 1 week Melvin Pao, NP [facility], [address]

## 2024-05-07 DIAGNOSIS — Z7982 Long term (current) use of aspirin: Secondary | ICD-10-CM | POA: Diagnosis not present

## 2024-05-07 DIAGNOSIS — M21861 Other specified acquired deformities of right lower leg: Secondary | ICD-10-CM | POA: Diagnosis not present

## 2024-05-07 DIAGNOSIS — E785 Hyperlipidemia, unspecified: Secondary | ICD-10-CM | POA: Diagnosis not present

## 2024-05-07 DIAGNOSIS — M25761 Osteophyte, right knee: Secondary | ICD-10-CM | POA: Diagnosis not present

## 2024-05-07 DIAGNOSIS — M1711 Unilateral primary osteoarthritis, right knee: Secondary | ICD-10-CM | POA: Diagnosis not present

## 2024-05-07 DIAGNOSIS — Z96652 Presence of left artificial knee joint: Secondary | ICD-10-CM | POA: Diagnosis not present

## 2024-05-08 DIAGNOSIS — Z96652 Presence of left artificial knee joint: Secondary | ICD-10-CM | POA: Diagnosis not present

## 2024-05-08 DIAGNOSIS — Z7982 Long term (current) use of aspirin: Secondary | ICD-10-CM | POA: Diagnosis not present

## 2024-05-08 DIAGNOSIS — M25761 Osteophyte, right knee: Secondary | ICD-10-CM | POA: Diagnosis not present

## 2024-05-08 DIAGNOSIS — E785 Hyperlipidemia, unspecified: Secondary | ICD-10-CM | POA: Diagnosis not present

## 2024-05-08 DIAGNOSIS — M1711 Unilateral primary osteoarthritis, right knee: Secondary | ICD-10-CM | POA: Diagnosis not present

## 2024-05-08 DIAGNOSIS — M21861 Other specified acquired deformities of right lower leg: Secondary | ICD-10-CM | POA: Diagnosis not present

## 2024-05-11 ENCOUNTER — Ambulatory Visit: Payer: Self-pay | Admitting: Emergency Medicine

## 2024-05-11 VITALS — Ht 72.0 in | Wt 260.0 lb

## 2024-05-11 DIAGNOSIS — Z0000 Encounter for general adult medical examination without abnormal findings: Secondary | ICD-10-CM | POA: Diagnosis not present

## 2024-05-11 NOTE — Patient Instructions (Addendum)
 [name] , Thank you for taking time out of your busy schedule to complete your Annual Wellness Visit with me. I enjoyed our conversation and look forward to speaking with you again next year. I, as well as your care team,  appreciate your ongoing commitment to your health goals. Please review the following plan we discussed and let me know if I can assist you in the future. Your Game plan/ To Do List    Referrals: None   Follow up Visits: We will see or speak with you next year for your Next Medicare AWV with our clinical staff Have you seen your provider in the last 6 months (3 months if uncontrolled diabetes)? Yes  Clinician Recommendations: Get the covid and flu vaccines at your convenience. Get the shingles vaccines at your local pharmacy at your convenience. I have included a list of eye doctors in the area to schedule a routine eye exam.  Your next colonoscopy is due 06/2024. Call and schedule at your earliest convenience. Aim for 30 minutes of exercise or brisk walking, 6-8 glasses of water, and 5 servings of fruits and vegetables each day.       This is a list of the screenings recommended for you:  Health Maintenance  Topic Date Due   Zoster (Shingles) Vaccine (1 of 2) Never done   Flu Shot  04/09/2024   COVID-19 Vaccine (4 - Mixed Product risk 2024-25 season) 05/10/2024   Colon Cancer Screening  06/22/2024   Screening for Lung Cancer  11/03/2024   Medicare Annual Wellness Visit  05/11/2025   DTaP/Tdap/Td vaccine (3 - Td or Tdap) 02/23/2031   Pneumococcal Vaccine for age over 21  Completed   Hepatitis C Screening  Completed   HPV Vaccine  Aged Out   Meningitis B Vaccine  Aged Out    Advanced directives: (ACP Link)Information on Advanced Care Planning can be found at [place]  Best boy Advance Health Care Directives Advance Health Care Directives. http://guzman.[email or web address]/ You may also get the forms at your doctor's office. Advance Care Planning is important because it:  [x]   Makes sure you receive the medical care that is consistent with your values, goals, and preferences  [x]  It provides guidance to your family and loved ones and reduces their decisional burden about whether or not they are making the right decisions based on your wishes.  Follow the link provided in your after visit summary or read over the paperwork we have mailed to you to help you started getting your Advance Directives in place. If you need assistance in completing these, please reach out to us  so that we can help you!  See attachments for Preventive Care and Fall Prevention Tips.   There are several Eye Doctors in your area. Here are a few that usually accept all insurance types:  [facility] [address] [place], [place] [address] Phone: [phone]  [facility] [address] [place], [place] [address] Phone: [phone]  [facility] [address] [place], [place] [address] Phone: [phone]  [facility] [address] [place], [place] [address] Phone: [phone]  [facility] [address] [place], [place] [address] Phone: [phone]  [facility] [address] [place], [place] [address] Phone: [phone]  Please let us  know if you require a referral for an eye exam appointment. Thank you!    Fall Prevention in the Home, Adult Falls can cause injuries and affect people of all ages. There  are many simple things that you can do to make your home safe and to help prevent falls. If you need it, ask for help making these changes. What actions can I take to prevent falls? General information Use good lighting in all rooms. Make sure to: Replace any light bulbs that burn out. Turn on lights if it is dark and use night-lights. Keep items that you use often in easy-to-reach places. Lower the shelves around your home if needed. Move furniture so that there are clear paths around it. Do not keep throw rugs or other things on the floor that  can make you trip. If any of your floors are uneven, fix them. Add color or contrast paint or tape to clearly mark and help you see: Grab bars or handrails. First and last steps of staircases. Where the edge of each step is. If you use a ladder or stepladder: Make sure that it is fully opened. Do not climb a closed ladder. Make sure the sides of the ladder are locked in place. Have someone hold the ladder while you use it. Know where your pets are as you move through your home. What can I do in the bathroom?     Keep the floor dry. Clean up any water that is on the floor right away. Remove soap buildup in the bathtub or shower. Buildup makes bathtubs and showers slippery. Use non-skid mats or decals on the floor of the bathtub or shower. Attach bath mats securely with double-sided, non-slip rug tape. If you need to sit down while you are in the shower, use a non-slip stool. Install grab bars by the toilet and in the bathtub and shower. Do not use towel bars as grab bars. What can I do in the bedroom? Make sure that you have a light by your bed that is easy to reach. Do not use any sheets or blankets on your bed that hang to the floor. Have a firm bench or chair with side arms that you can use for support when you get dressed. What can I do in the kitchen? Clean up any spills right away. If you need to reach something above you, use a sturdy step stool that has a grab bar. Keep electrical cables out of the way. Do not use floor polish or wax that makes floors slippery. What can I do with my stairs? Do not leave anything on the stairs. Make sure that you have a light switch at the top and the bottom of the stairs. Have them installed if you do not have them. Make sure that there are handrails on both sides of the stairs. Fix handrails that are broken or loose. Make sure that handrails are as long as the staircases. Install non-slip stair treads on all stairs in your home if they do  not have carpet. Avoid having throw rugs at the top or bottom of stairs, or secure the rugs with carpet tape to prevent them from moving. Choose a carpet design that does not hide the edge of steps on the stairs. Make sure that carpet is firmly attached to the stairs. Fix any carpet that is loose or worn. What can I do on the outside of my home? Use bright outdoor lighting. Repair the edges of walkways and driveways and fix any cracks. Clear paths of anything that can make you trip, such as tools or rocks. Add color or contrast paint or tape to clearly mark and help you see high doorway thresholds.  Trim any bushes or trees on the main path into your home. Check that handrails are securely fastened and in good repair. Both sides of all steps should have handrails. Install guardrails along the edges of any raised decks or porches. Have leaves, snow, and ice cleared regularly. Use sand, salt, or ice melt on walkways during winter months if you live where there is ice and snow. In the garage, clean up any spills right away, including grease or oil spills. What other actions can I take? Review your medicines with your health care provider. Some medicines can make you confused or feel dizzy. This can increase your chance of falling. Wear closed-toe shoes that fit well and support your feet. Wear shoes that have rubber soles and low heels. Use a cane, walker, scooter, or crutches that help you move around if needed. Talk with your provider about other ways that you can decrease your risk of falls. This may include seeing a physical therapist to learn to do exercises to improve movement and strength. Where to find more information Centers for Disease Control and Prevention, STEADI: TonerPromos.no [employer] on Aging: BaseRingTones.pl National Institute on Aging: BaseRingTones.pl Contact a health care provider if: You are afraid of falling at home. You feel weak, drowsy, or dizzy at home. You fall at  home. Get help right away if you: Lose consciousness or have trouble moving after a fall. Have a fall that causes a head injury. These symptoms may be an emergency. Get help right away. Call 911. Do not wait to see if the symptoms will go away. Do not drive yourself to the hospital. This information is not intended to replace advice given to you by your health care provider. Make sure you discuss any questions you have with your health care provider. Document Revised: 04/29/2022 Document Reviewed: 04/29/2022 Elsevier Patient Education  2024 [employer].

## 2024-05-11 NOTE — Progress Notes (Signed)
 Subjective:   Mark Sexton is a 68 y.o. who presents for a Medicare Wellness preventive visit.  As a reminder, Annual Wellness Visits don't include a physical exam, and some assessments may be limited, especially if this visit is performed virtually. We may recommend an in-person follow-up visit with your provider if needed.  Visit Complete: Virtual I connected with  Manus D Mckillop on 05/11/24 by a audio enabled telemedicine application and verified that I am speaking with the correct person using two identifiers.  Patient Location: Home  Provider Location: Office/Clinic  I discussed the limitations of evaluation and management by telemedicine. The patient expressed understanding and agreed to proceed.  Vital Signs: Because this visit was a virtual/telehealth visit, some criteria may be missing or patient reported. Any vitals not documented were not able to be obtained and vitals that have been documented are patient reported.  VideoDeclined- This patient declined Librarian, academic. Therefore the visit was completed with audio only.  Persons Participating in Visit: Patient.  AWV Questionnaire: No: Patient Medicare AWV questionnaire was not completed prior to this visit.  Cardiac Risk Factors include: advanced age (>65men, >69 women);male gender;obesity (BMI >30kg/m2)     Objective:    Today's Vitals   05/11/24 1042 05/11/24 1043  Weight: 260 lb (117.9 kg)   Height: 6' (1.829 m)   PainSc:  5    Body mass index is 35.26 kg/m.     05/11/2024   10:56 AM 06/23/2023    8:56 AM 05/06/2023   10:48 AM 04/18/2022   10:34 AM 09/11/2016   10:21 AM  Advanced Directives  Does Patient Have a Medical Advance Directive? No No No No No   Would patient like information on creating a medical advance directive? Yes (MAU/Ambulatory/Procedural Areas - Information given) No - Patient declined Yes (MAU/Ambulatory/Procedural Areas - Information given) No - Patient  declined      Data saved with a previous flowsheet row definition    Current Medications (verified) Outpatient Encounter Medications as of 05/11/2024  Medication Sig   aspirin  EC 81 MG tablet Take 1 tablet (81 mg total) by mouth daily. Swallow whole.   atorvastatin  (LIPITOR) 20 MG tablet Take 1 tablet (20 mg total) by mouth daily.   docusate sodium (COLACE) 100 MG capsule Take 100 mg by mouth 2 (two) times daily.   gabapentin (NEURONTIN) 300 MG capsule Take 300 mg by mouth 3 (three) times daily as needed.   meloxicam  (MOBIC ) 15 MG tablet Take 1 tablet by mouth once daily   methocarbamol (ROBAXIN) 500 MG tablet Take 500 mg by mouth every 8 (eight) hours as needed.   Oxycodone HCl 10 MG TABS Take 10 mg by mouth every 4 (four) hours as needed.   traZODone  (DESYREL ) 100 MG tablet TAKE 2 TABLETS BY MOUTH AT BEDTIME AS NEEDED FOR SLEEP   No facility-administered encounter medications on file as of 05/11/2024.    Allergies (verified) Penicillins   History: Past Medical History:  Diagnosis Date   Allergy    Arthritis    Past Surgical History:  Procedure Laterality Date   COLONOSCOPY WITH PROPOFOL  N/A 06/23/2023   Procedure: COLONOSCOPY WITH PROPOFOL ;  Surgeon: Therisa Bi, MD;  Location: [facility] ENDOSCOPY;  Service: Gastroenterology;  Laterality: N/A;   HEMOSTASIS CLIP PLACEMENT  06/23/2023   Procedure: HEMOSTASIS CLIP PLACEMENT;  Surgeon: Therisa Bi, MD;  Location: [facility] ENDOSCOPY;  Service: Gastroenterology;;   POLYPECTOMY  06/23/2023   Procedure: POLYPECTOMY;  Surgeon: Therisa Bi, MD;  Location: [facility] ENDOSCOPY;  Service: Gastroenterology;;   SUBMUCOSAL LIFTING INJECTION  06/23/2023   Procedure: SUBMUCOSAL LIFTING INJECTION;  Surgeon: Therisa Bi, MD;  Location: [facility] ENDOSCOPY;  Service: Gastroenterology;;   Family History  Problem Relation Age of Onset   Cancer Mother    COPD Father    Birth defects Maternal Grandmother    Social History   Socioeconomic History   Marital status:  Significant Other    Spouse name: Not on file   Number of children: 2   Years of education: Not on file   Highest education level: Not on file  Occupational History   Occupation: retired Music therapist  Tobacco Use   Smoking status: Former    Current packs/day: 0.00    Average packs/day: 0.6 packs/day for 38.0 years (22.8 ttl pk-yrs)    Types: Cigarettes    Start date: 11/26/1981    Quit date: 11/27/2019    Years since quitting: 4.4   Smokeless tobacco: Never  Vaping Use   Vaping status: Former   Quit date: 05/05/2020   Substances: Nicotine, Flavoring  Substance and Sexual Activity   Alcohol use: Not Currently    Alcohol/week: 42.0 standard drinks of alcohol    Types: 42 Cans of beer per week    Comment: 6 pack of beer daily, last use 04/04/24   Drug use: Not Currently    Types: Marijuana    Comment: last use ~2005   Sexual activity: Yes  Other Topics Concern   Not on file  Social History Narrative   Has lived with girlfriend for 20 years, 2 children from previous relationships   Social Drivers of Corporate investment banker Strain: Low Risk  (05/11/2024)   Overall Financial Resource Strain (CARDIA)    Difficulty of Paying Living Expenses: Not hard at all  Food Insecurity: No Food Insecurity (05/11/2024)   Hunger Vital Sign    Worried About Running Out of Food in the Last Year: Never true    Ran Out of Food in the Last Year: Never true  Transportation Needs: No Transportation Needs (05/11/2024)   PRAPARE - Administrator, Civil Service (Medical): No    Lack of Transportation (Non-Medical): No  Physical Activity: Sufficiently Active (05/11/2024)   Exercise Vital Sign    Days of Exercise per Week: 5 days    Minutes of Exercise per Session: 30 min  Stress: No Stress Concern Present (05/11/2024)   [employer] of Occupational Health - Occupational Stress Questionnaire    Feeling of Stress: Not at all  Social Connections: Moderately Isolated (05/11/2024)   Social  Connection and Isolation Panel    Frequency of Communication with Friends and Family: More than three times a week    Frequency of Social Gatherings with Friends and Family: Three times a week    Attends Religious Services: Never    Active Member of Clubs or Organizations: No    Attends Banker Meetings: Never    Marital Status: Living with partner    Tobacco Counseling Counseling given: Not Answered    Clinical Intake:  Pre-visit preparation completed: Yes  Pain : 0-10 Pain Score: 5  Pain Type: Other (Comment) (post surgical) Pain Location: Knee Pain Orientation: Right Pain Descriptors / Indicators: Aching     BMI - recorded: 35.26 Nutritional Status: BMI > 30  Obese Diabetes: No  Lab Results  Component Value Date   HGBA1C 6.1 (H) 11/07/2023   HGBA1C 5.4 01/16/2022   HGBA1C 6.0 02/22/2021  How often do you need to have someone help you when you read instructions, pamphlets, or other written materials from your doctor or pharmacy?: 1 - Never  Interpreter Needed?: No  Information entered by :: Vina Ned, CMA   Activities of Daily Living     05/11/2024   10:46 AM  In your present state of health, do you have any difficulty performing the following activities:  Hearing? 0  Vision? 0  Difficulty concentrating or making decisions? 0  Walking or climbing stairs? 0  Dressing or bathing? 0  Doing errands, shopping? 0  Preparing Food and eating ? N  Using the Toilet? N  In the past six months, have you accidently leaked urine? N  Do you have problems with loss of bowel control? N  Managing your Medications? N  Managing your Finances? N  Housekeeping or managing your Housekeeping? N    Patient Care Team: Melvin Pao, NP as PCP - General (Nurse Practitioner) Darliss Rogue, MD as PCP - Cardiology (Cardiology) Ortho, [facility] (Orthopedic Surgery)  I have updated your Care Teams any recent Medical Services you may have received from  other providers in the past year.     Assessment:   This is a routine wellness examination for Alexandre.  Hearing/Vision screen Hearing Screening - Comments:: Denies hearing loss  Vision Screening - Comments:: Needs routine eye exam, included list of eye doctors in AVS   Goals Addressed             This Visit's Progress    Patient Stated       Recover from knee replacement and lose 20 lbs       Depression Screen     05/11/2024   10:52 AM 08/20/2023    3:00 PM 05/06/2023   10:45 AM 12/19/2022    1:40 PM 05/29/2022    8:18 AM 04/18/2022   10:42 AM 02/26/2022    8:41 AM  PHQ 2/9 Scores  PHQ - 2 Score 1 2 0 0 2 0 0  PHQ- 9 Score 1 8 0 4 5  0    Fall Risk     05/11/2024   10:58 AM 05/06/2023   10:53 AM 12/19/2022    1:40 PM 05/29/2022    8:17 AM 04/18/2022   10:34 AM  Fall Risk   Falls in the past year? 0 0 0 1 0  Number falls in past yr: 0 0 0 0 0  Injury with Fall? 0 0 0 1 0  Risk for fall due to : No Fall Risks No Fall Risks History of fall(s) History of fall(s)   Follow up Falls evaluation completed Falls prevention discussed Falls evaluation completed Falls evaluation completed  Falls evaluation completed;Education provided;Falls prevention discussed      Data saved with a previous flowsheet row definition    MEDICARE RISK AT HOME:  Medicare Risk at Home Any stairs in or around the home?: Yes If so, are there any without handrails?: No Home free of loose throw rugs in walkways, pet beds, electrical cords, etc?: Yes Adequate lighting in your home to reduce risk of falls?: Yes Life alert?: No Use of a cane, walker or w/c?: No Grab bars in the bathroom?: No Shower chair or bench in shower?: Yes Elevated toilet seat or a handicapped toilet?: Yes  TIMED UP AND GO:  Was the test performed?  No  Cognitive Function: 6CIT completed        05/11/2024   10:59 AM 05/06/2023  10:54 AM 04/18/2022   10:38 AM  6CIT Screen  What Year? 0 points 0 points 0 points  What  month? 0 points 3 points 0 points  What time? 0 points 0 points 0 points  Count back from 20 0 points 0 points 0 points  Months in reverse 4 points 0 points 0 points  Repeat phrase 0 points 0 points 0 points  Total Score 4 points 3 points 0 points    Immunizations Immunization History  Administered Date(s) Administered   Fluad Quad(high Dose 65+) 05/29/2022   Fluad Trivalent(High Dose 65+) 11/07/2023   Moderna Sars-Covid-2 Vaccination 12/02/2019, 12/30/2019   PNEUMOCOCCAL CONJUGATE-20 05/29/2022   Pfizer(Comirnaty)Fall Seasonal Vaccine 12 years and older 11/07/2023   Tdap 09/11/2016, 02/22/2021    Screening Tests Health Maintenance  Topic Date Due   Zoster Vaccines- Shingrix (1 of 2) Never done   INFLUENZA VACCINE  04/09/2024   COVID-19 Vaccine (4 - Mixed Product risk 2024-25 season) 05/10/2024   Colonoscopy  06/22/2024   Lung Cancer Screening  11/03/2024   Medicare Annual Wellness (AWV)  05/11/2025   DTaP/Tdap/Td (3 - Td or Tdap) 02/23/2031   Pneumococcal Vaccine: 50+ Years  Completed   Hepatitis C Screening  Completed   HPV VACCINES  Aged Out   Meningococcal B Vaccine  Aged Out    Health Maintenance  Health Maintenance Due  Topic Date Due   Zoster Vaccines- Shingrix (1 of 2) Never done   INFLUENZA VACCINE  04/09/2024   COVID-19 Vaccine (4 - Mixed Product risk 2024-25 season) 05/10/2024   Colonoscopy  06/22/2024   Health Maintenance Items Addressed: See Nurse Notes at the end of this note  Additional Screening:  Vision Screening: Recommended annual ophthalmology exams for early detection of glaucoma and other disorders of the eye. Would you like a referral to an eye doctor? No    Dental Screening: Recommended annual dental exams for proper oral hygiene  Community Resource Referral / Chronic Care Management: CRR required this visit?  No   CCM required this visit?  No   Plan:    I have personally reviewed and noted the following in the patient's chart:    Medical and social history Use of alcohol, tobacco or illicit drugs  Current medications and supplements including opioid prescriptions. Patient is currently taking opioid prescriptions. Information provided to patient regarding non-opioid alternatives. Patient advised to discuss non-opioid treatment plan with their provider. Functional ability and status Nutritional status Physical activity Advanced directives List of other physicians Hospitalizations, surgeries, and ER visits in previous 12 months Vitals Screenings to include cognitive, depression, and falls Referrals and appointments  In addition, I have reviewed and discussed with patient certain preventive protocols, quality metrics, and best practice recommendations. A written personalized care plan for preventive services as well as general preventive health recommendations were provided to patient.   Vina Ned, CMA   05/11/2024   After Visit Summary: (MyChart) Due to this being a telephonic visit, the after visit summary with patients personalized plan was offered to patient via MyChart   Notes:  6 CIT Score - 4 RS 05/17/24 OV to 06/16/24 per patient's request. Had knee replacement surgery on 05/06/24 and will not have anyone to drive him to OV in Sept. Patient will get flu and covid vaccines Needs Shingrix vaccines (pharmacy)

## 2024-05-13 DIAGNOSIS — M1711 Unilateral primary osteoarthritis, right knee: Secondary | ICD-10-CM | POA: Diagnosis not present

## 2024-05-17 ENCOUNTER — Ambulatory Visit: Payer: 59 | Admitting: Nurse Practitioner

## 2024-05-18 DIAGNOSIS — M1711 Unilateral primary osteoarthritis, right knee: Secondary | ICD-10-CM | POA: Diagnosis not present

## 2024-06-16 ENCOUNTER — Ambulatory Visit: Admitting: Nurse Practitioner

## 2024-07-13 ENCOUNTER — Other Ambulatory Visit: Payer: Self-pay | Admitting: Nurse Practitioner

## 2024-07-13 ENCOUNTER — Ambulatory Visit (INDEPENDENT_AMBULATORY_CARE_PROVIDER_SITE_OTHER): Admitting: Nurse Practitioner

## 2024-07-13 ENCOUNTER — Encounter: Payer: Self-pay | Admitting: Nurse Practitioner

## 2024-07-13 VITALS — BP 137/78 | HR 73 | Temp 98.1°F | Ht 72.0 in | Wt 272.2 lb

## 2024-07-13 DIAGNOSIS — Z23 Encounter for immunization: Secondary | ICD-10-CM | POA: Diagnosis not present

## 2024-07-13 DIAGNOSIS — R7303 Prediabetes: Secondary | ICD-10-CM | POA: Insufficient documentation

## 2024-07-13 DIAGNOSIS — G479 Sleep disorder, unspecified: Secondary | ICD-10-CM

## 2024-07-13 DIAGNOSIS — K760 Fatty (change of) liver, not elsewhere classified: Secondary | ICD-10-CM | POA: Insufficient documentation

## 2024-07-13 DIAGNOSIS — J439 Emphysema, unspecified: Secondary | ICD-10-CM | POA: Insufficient documentation

## 2024-07-13 DIAGNOSIS — E7800 Pure hypercholesterolemia, unspecified: Secondary | ICD-10-CM | POA: Diagnosis not present

## 2024-07-13 DIAGNOSIS — I700 Atherosclerosis of aorta: Secondary | ICD-10-CM | POA: Insufficient documentation

## 2024-07-13 MED ORDER — MELOXICAM 15 MG PO TABS: 15.0000 mg | ORAL_TABLET | Freq: Every day | ORAL | 0 refills | Status: AC

## 2024-07-13 MED ORDER — TRAZODONE HCL 100 MG PO TABS: 100.0000 mg | ORAL_TABLET | Freq: Every day | ORAL | 1 refills | Status: DC

## 2024-07-13 NOTE — Assessment & Plan Note (Signed)
 Found on lung CT 2025.

## 2024-07-13 NOTE — Progress Notes (Signed)
 BP 137/78 (BP Location: Right Arm, Cuff Size: Normal)   Pulse 73   Temp 98.1 F (36.7 C) (Oral)   Ht 6' (1.829 m)   Wt 272 lb 3.2 oz (123.5 kg)   SpO2 97%   BMI 36.92 kg/m    Subjective:    Patient ID: Mark Sexton, male    DOB: Oct 12, 1955, 68 y.o.   MRN: [id number]  HPI: Mark Sexton is a 68 y.o. male  Chief Complaint  Patient presents with   Hyperlipidemia   Prediabetes   HYPERLIPIDEMIA Hyperlipidemia status: excellent compliance Satisfied with current treatment?  yes Side effects:  no Medication compliance: excellent compliance Past cholesterol meds: atorvastain (lipitor) Supplements: none Aspirin :  yes The 10-year ASCVD risk score (Arnett DK, et al., 2019) is: 17.5%   Values used to calculate the score:     Age: 9 years     Clincally relevant sex: Male     Is Non-Hispanic African American: No     Diabetic: No     Tobacco smoker: No     Systolic Blood Pressure: 137 mmHg     Is BP treated: No     HDL Cholesterol: 46 mg/dL     Total Cholesterol: 184 mg/dL Chest pain:  no Coronary artery disease:  no Family history CAD:  no Family history early CAD:  no  Impaired Fasting Glucose HbA1C:  Lab Results  Component Value Date   HGBA1C 6.1 (H) 11/07/2023   Duration of elevated blood sugar:  Polydipsia: no Polyuria: no Weight change: no Visual disturbance: no Glucose Monitoring: no    Accucheck frequency: Not Checking    Fasting glucose:     Post prandial:  Diabetic Education: Not Completed Family history of diabetes: no   Denies HA, CP, SOB, dizziness, palpitations, visual changes, and lower extremity swelling.  Relevant past medical, surgical, family and social history reviewed and updated as indicated. Interim medical history since our last visit reviewed. Allergies and medications reviewed and updated.  Review of Systems  Eyes:  Negative for visual disturbance.  Respiratory:  Negative for shortness of breath.   Cardiovascular:  Negative for  chest pain and leg swelling.  Endocrine: Negative for polydipsia and polyuria.  Neurological:  Negative for light-headedness and headaches.    Per HPI unless specifically indicated above     Objective:    BP 137/78 (BP Location: Right Arm, Cuff Size: Normal)   Pulse 73   Temp 98.1 F (36.7 C) (Oral)   Ht 6' (1.829 m)   Wt 272 lb 3.2 oz (123.5 kg)   SpO2 97%   BMI 36.92 kg/m   Wt Readings from Last 3 Encounters:  07/13/24 272 lb 3.2 oz (123.5 kg)  05/11/24 260 lb (117.9 kg)  12/02/23 270 lb (122.5 kg)    Physical Exam Vitals and nursing note reviewed.  Constitutional:      General: He is not in acute distress.    Appearance: Normal appearance. He is obese. He is not ill-appearing, toxic-appearing or diaphoretic.  HENT:     Head: Normocephalic.     Right Ear: External ear normal.     Left Ear: External ear normal.     Nose: Nose normal. No congestion or rhinorrhea.     Mouth/Throat:     Mouth: Mucous membranes are moist.  Eyes:     General:        Right eye: No discharge.        Left eye: No discharge.  Extraocular Movements: Extraocular movements intact.     Conjunctiva/sclera: Conjunctivae normal.     Pupils: Pupils are equal, round, and reactive to light.  Cardiovascular:     Rate and Rhythm: Normal rate and regular rhythm.     Heart sounds: No murmur heard. Pulmonary:     Effort: Pulmonary effort is normal. No respiratory distress.     Breath sounds: Normal breath sounds. No wheezing, rhonchi or rales.  Abdominal:     General: Abdomen is flat. Bowel sounds are normal.  Musculoskeletal:     Cervical back: Normal range of motion and neck supple.  Skin:    General: Skin is warm and dry.     Capillary Refill: Capillary refill takes less than 2 seconds.  Neurological:     General: No focal deficit present.     Mental Status: He is alert and oriented to person, place, and time.  Psychiatric:        Mood and Affect: Mood normal.        Behavior: Behavior  normal.        Thought Content: Thought content normal.        Judgment: Judgment normal.     Results for orders placed or performed in visit on 11/07/23  TSH   Collection Time: 11/07/23 10:03 AM  Result Value Ref Range   TSH 1.950 0.450 - 4.500 uIU/mL  PSA   Collection Time: 11/07/23 10:03 AM  Result Value Ref Range   Prostate Specific Ag, Serum 6.3 (H) 0.0 - 4.0 ng/mL  Lipid panel   Collection Time: 11/07/23 10:03 AM  Result Value Ref Range   Cholesterol, Total 184 100 - 199 mg/dL   Triglycerides 845 (H) 0 - 149 mg/dL   HDL 46 >60 mg/dL   VLDL Cholesterol Cal 27 5 - 40 mg/dL   LDL Chol Calc (NIH) 888 (H) 0 - 99 mg/dL   Chol/HDL Ratio 4.0 0.0 - 5.0 ratio  CBC with Differential/Platelet   Collection Time: 11/07/23 10:03 AM  Result Value Ref Range   WBC 8.8 3.4 - 10.8 x10E3/uL   RBC 4.76 4.14 - 5.80 x10E6/uL   Hemoglobin 15.6 13.0 - 17.7 g/dL   Hematocrit 54.6 62.4 - 51.0 %   MCV 95 79 - 97 fL   MCH 32.8 26.6 - 33.0 pg   MCHC 34.4 31.5 - 35.7 g/dL   RDW 87.6 88.3 - 84.5 %   Platelets 269 150 - 450 x10E3/uL   Neutrophils 60 Not Estab. %   Lymphs 20 Not Estab. %   Monocytes 12 Not Estab. %   Eos 6 Not Estab. %   Basos 1 Not Estab. %   Neutrophils Absolute 5.3 1.4 - 7.0 x10E3/uL   Lymphocytes Absolute 1.8 0.7 - 3.1 x10E3/uL   Monocytes Absolute 1.0 (H) 0.1 - 0.9 x10E3/uL   EOS (ABSOLUTE) 0.5 (H) 0.0 - 0.4 x10E3/uL   Basophils Absolute 0.1 0.0 - 0.2 x10E3/uL   Immature Granulocytes 1 Not Estab. %   Immature Grans (Abs) 0.1 0.0 - 0.1 x10E3/uL  Comprehensive metabolic panel   Collection Time: 11/07/23 10:03 AM  Result Value Ref Range   Glucose 98 70 - 99 mg/dL   BUN 15 8 - 27 mg/dL   Creatinine, Ser 8.87 0.76 - 1.27 mg/dL   eGFR 72 >40 fO/fpw/8.26   BUN/Creatinine Ratio 13 10 - 24   Sodium 141 134 - 144 mmol/L   Potassium 4.2 3.5 - 5.2 mmol/L   Chloride 105 96 - 106  mmol/L   CO2 22 20 - 29 mmol/L   Calcium  9.6 8.6 - 10.2 mg/dL   Total Protein 6.7 6.0 - 8.5 g/dL    Albumin 4.5 3.9 - 4.9 g/dL   Globulin, Total 2.2 1.5 - 4.5 g/dL   Bilirubin Total 0.4 0.0 - 1.2 mg/dL   Alkaline Phosphatase 87 44 - 121 IU/L   AST 24 0 - 40 IU/L   ALT 36 0 - 44 IU/L  HgB A1c   Collection Time: 11/07/23 10:03 AM  Result Value Ref Range   Hgb A1c MFr Bld 6.1 (H) 4.8 - 5.6 %   Est. average glucose Bld gHb Est-mCnc 128 mg/dL  Urinalysis, Routine w reflex microscopic   Collection Time: 11/07/23 10:04 AM  Result Value Ref Range   Specific Gravity, UA >1.030 (H) 1.005 - 1.030   pH, UA 5.5 5.0 - 7.5   Color, UA Yellow Yellow   Appearance Ur Clear Clear   Leukocytes,UA Negative Negative   Protein,UA Negative Negative/Trace   Glucose, UA Negative Negative   Ketones, UA Negative Negative   RBC, UA Negative Negative   Bilirubin, UA Negative Negative   Urobilinogen, Ur 0.2 0.2 - 1.0 mg/dL   Nitrite, UA Negative Negative   Microscopic Examination Comment   [id number] 11+Oxyco+Alc+Crt-Bund   Collection Time: 11/07/23 10:04 AM  Result Value Ref Range   Ethanol Negative Cutoff=0.020 %   Amphetamines, Urine Negative Cutoff=1000 ng/mL   Barbiturate Negative Cutoff=200 ng/mL   BENZODIAZ UR QL Negative Cutoff=200 ng/mL   Cannabinoid Quant, Ur Negative Cutoff=50 ng/mL   Cocaine (Metabolite) Negative Cutoff=300 ng/mL   OPIATE SCREEN URINE Negative Cutoff=300 ng/mL   Oxycodone/Oxymorphone, Urine Negative Cutoff=300 ng/mL   Phencyclidine Negative Cutoff=25 ng/mL   Methadone Screen, Urine Negative Cutoff=300 ng/mL   Propoxyphene Negative Cutoff=300 ng/mL   Meperidine Negative Cutoff=200 ng/mL   Tramadol  Negative Cutoff=200 ng/mL   Creatinine 176.3 20.0 - 300.0 mg/dL   pH, Urine 5.3 4.5 - 8.9      Assessment & Plan:   Problem List Items Addressed This Visit       Cardiovascular and Mediastinum   Aortic atherosclerosis   Found on Lung CT 2025.  Continue with Atorvastatin  and ASA.          Respiratory   Emphysema lung (HCC) - Primary   Found on CT Lung screening  February 2025.  Currently controlled without medication.  Follow up in 6 months.  Call sooner if concerns arise.         Digestive   Hepatic steatosis   Found on lung CT 2025.         Other   Prediabetes   Chronic.  Controlled with diet.  Last A1c was 6.1%.  Labs ordered today.  Return to clinic in 6 months for reevaluation.  Call sooner if concerns arise.        Relevant Orders   Comp Met (CMET)   HgB A1c   Hypercholesteremia   Chronic.  Controlled.  Continue with current medication regimen of Atorvastatin  daily.  Labs ordered today.  Return to clinic in 6 months for reevaluation.  Call sooner if concerns arise.        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  Recommended eating smaller high protein, low fat meals more frequently and exercising 30 mins a day 5 times a week with a goal of 10-15lb weight loss in the next 3 months.  Other Visit Diagnoses       Need for influenza vaccination       Relevant Orders   Flu vaccine HIGH DOSE PF(Fluzone Trivalent) (Completed)     Sleep disturbance       Controlled with Trazodone .  Continue with current regimen.  Refills sent today.        Follow up plan: Return in about 6 months (around 01/10/2025) for Physical and Fasting labs.

## 2024-07-13 NOTE — Assessment & Plan Note (Signed)
 BMI of 36 with COPD.  Recommended eating smaller high protein, low fat meals more frequently and exercising 30 mins a day 5 times a week with a goal of 10-15lb weight loss in the next 3 months.

## 2024-07-13 NOTE — Assessment & Plan Note (Signed)
 Chronic.  Controlled.  Continue with current medication regimen of Atorvastatin daily.  Labs ordered today.  Return to clinic in 6 months for reevaluation.  Call sooner if concerns arise.

## 2024-07-13 NOTE — Assessment & Plan Note (Signed)
 Found on CT Lung screening February 2025.  Currently controlled without medication.  Follow up in 6 months.  Call sooner if concerns arise.

## 2024-07-13 NOTE — Assessment & Plan Note (Signed)
 Chronic.  Controlled with diet.  Last A1c was 6.1%.  Labs ordered today.  Return to clinic in 6 months for reevaluation.  Call sooner if concerns arise.

## 2024-07-13 NOTE — Assessment & Plan Note (Signed)
 Found on Lung CT 2025.  Continue with Atorvastatin  and ASA.

## 2024-07-14 ENCOUNTER — Ambulatory Visit: Payer: Self-pay | Admitting: Nurse Practitioner

## 2024-07-14 ENCOUNTER — Telehealth: Payer: Self-pay | Admitting: Nurse Practitioner

## 2024-07-14 LAB — COMPREHENSIVE METABOLIC PANEL WITH GFR
ALT: 23 IU/L (ref 0–44)
AST: 17 IU/L (ref 0–40)
Albumin: 4.6 g/dL (ref 3.9–4.9)
Alkaline Phosphatase: 107 IU/L (ref 47–123)
BUN/Creatinine Ratio: 8 — ABNORMAL LOW (ref 10–24)
BUN: 9 mg/dL (ref 8–27)
Bilirubin Total: 0.3 mg/dL (ref 0.0–1.2)
CO2: 20 mmol/L (ref 20–29)
Calcium: 9.6 mg/dL (ref 8.6–10.2)
Chloride: 105 mmol/L (ref 96–106)
Creatinine, Ser: 1.08 mg/dL (ref 0.76–1.27)
Globulin, Total: 2.2 g/dL (ref 1.5–4.5)
Glucose: 128 mg/dL — ABNORMAL HIGH (ref 70–99)
Potassium: 4.4 mmol/L (ref 3.5–5.2)
Sodium: 141 mmol/L (ref 134–144)
Total Protein: 6.8 g/dL (ref 6.0–8.5)
eGFR: 75 mL/min/1.73 (ref >59)

## 2024-07-14 LAB — LIPID PANEL
Chol/HDL Ratio: 2.7 ratio (ref 0.0–5.0)
Cholesterol, Total: 127 mg/dL (ref 100–199)
HDL: 47 mg/dL (ref >39)
LDL Chol Calc (NIH): 63 mg/dL (ref 0–99)
Triglycerides: 88 mg/dL (ref 0–149)
VLDL Cholesterol Cal: 17 mg/dL (ref 5–40)

## 2024-07-14 LAB — HEMOGLOBIN A1C
Est. average glucose Bld gHb Est-mCnc: 117 mg/dL
Hgb A1c MFr Bld: 5.7 % — ABNORMAL HIGH (ref 4.8–5.6)

## 2024-07-14 NOTE — Telephone Encounter (Signed)
 Requested medication (s) are due for refill today:  no  Requested medication (s) are on the active medication list: Yes  Last refill:  07/13/24  Future visit scheduled: yes  Notes to clinic:  See pharmacy request.    Requested Prescriptions  Pending Prescriptions Disp Refills   traZODone  (DESYREL ) 100 MG tablet [Pharmacy Med Name: TraZODone  100MG      TAB] 180 tablet 1    Sig: TAKE 1 TABLET BY MOUTH AT BEDTIME     Psychiatry: Antidepressants - Serotonin Modulator Passed - 07/14/2024  1:41 PM      Passed - Valid encounter within last 6 months    Recent Outpatient Visits           Yesterday Emphysema lung [facility])   [place] [facility] Melvin Pao, NP   8 months ago Annual physical exam   [place] [facility] Melvin Pao, NP

## 2024-07-14 NOTE — Telephone Encounter (Signed)
 Reached out to the insurance company to confirm fax number Message did not include an ext.. Will call patient to provider with Medical record Request  the status of the request.

## 2024-07-14 NOTE — Telephone Encounter (Signed)
 Copied from CRM #[id number]. Topic: Medical Record Request - Payor/Billing Request >> Jul 14, 2024  8:43 AM Tobias CROME wrote: Reason for CRM: Patient states he is attempting to change insurance providers. Patient is making the switch to [employer]. They are needing  patient's medical records. [facility] has attempted to fax request to office twice now and have not heard back. [facility] will not approve patient's policy until they receive records requested.   Patient requesting for provider to reach out to insurance, [phone]

## 2024-07-14 NOTE — Telephone Encounter (Signed)
 Please clarify directions for the prescription.

## 2024-07-19 NOTE — Telephone Encounter (Signed)
 Dusty with [employer] calling to confirm chronic conditions. Will be faxing form request to office.

## 2024-07-20 NOTE — Telephone Encounter (Signed)
Awaiting form to be received.

## 2024-07-21 ENCOUNTER — Telehealth: Payer: Self-pay | Admitting: Nurse Practitioner

## 2024-07-21 NOTE — Telephone Encounter (Signed)
 See other phone encounter.

## 2024-07-21 NOTE — Telephone Encounter (Unsigned)
 Copied from CRM [id number]. Topic: General - Other >> Jul 06, 2024  8:33 AM Tobias CROME wrote: Reason for CRM: Unice with [facility] calling to verify chronic conditions for patient. Patient chose a chronic condition plan and insurance is needing to verify any conditions patient may have. Decision form to verify conditions was faxed to office 07/02/2024.   Requesting for form to be faxed back as soon as possible so patient may be able to keep benefits. >> Jul 21, 2024  8:21 AM Emylou G wrote: [facility] calling again to find out if we got the fax see attached msg below.. they sent it on 10/28 and 11/10.SABRA Pls call them back [phone] to confirm ( option 1 )

## 2024-07-21 NOTE — Telephone Encounter (Signed)
 Tried calling [employer] back at the number provided. No answer from a representative. Form has not been received.   OK for [facility] to speak with the patient's insurance and let them know that we have not received the form that was faxed over.

## 2024-08-02 ENCOUNTER — Other Ambulatory Visit: Payer: Self-pay | Admitting: Nurse Practitioner

## 2024-08-03 NOTE — Telephone Encounter (Signed)
 Requested Prescriptions  Pending Prescriptions Disp Refills   meloxicam  (MOBIC ) 15 MG tablet [Pharmacy Med Name: Meloxicam  15 MG Oral Tablet] 90 tablet 0    Sig: Take 1 tablet by mouth once daily     Analgesics:  COX2 Inhibitors Failed - 08/03/2024 11:00 AM      Failed - Manual Review: Labs are only required if the patient has taken medication for more than 8 weeks.      Passed - HGB in normal range and within 360 days    Hemoglobin  Date Value Ref Range Status  11/07/2023 15.6 13.0 - 17.7 g/dL Final         Passed - Cr in normal range and within 360 days    Creatinine  Date Value Ref Range Status  11/07/2023 176.3 20.0 - 300.0 mg/dL Final   Creatinine, Ser  Date Value Ref Range Status  07/13/2024 1.08 0.76 - 1.27 mg/dL Final         Passed - HCT in normal range and within 360 days    Hematocrit  Date Value Ref Range Status  11/07/2023 45.3 37.5 - 51.0 % Final         Passed - AST in normal range and within 360 days    AST  Date Value Ref Range Status  07/13/2024 17 0 - 40 IU/L Final         Passed - ALT in normal range and within 360 days    ALT  Date Value Ref Range Status  07/13/2024 23 0 - 44 IU/L Final         Passed - eGFR is 30 or above and within 360 days    GFR, Estimated  Date Value Ref Range Status  09/25/2020 >60 >60 mL/min Final    Comment:    (NOTE) Calculated using the CKD-EPI Creatinine Equation (2021)    eGFR  Date Value Ref Range Status  07/13/2024 75 >59 mL/min/1.73 Final         Passed - Patient is not pregnant      Passed - Valid encounter within last 12 months    Recent Outpatient Visits           3 weeks ago Emphysema lung (HCC)   [place] [facility] Melvin Pao, NP   9 months ago Annual physical exam   [place] [facility] Melvin Pao, NP

## 2025-01-12 ENCOUNTER — Ambulatory Visit: Admitting: Nurse Practitioner

## 2025-05-17 ENCOUNTER — Ambulatory Visit
